# Patient Record
Sex: Male | Born: 1965 | Race: Black or African American | Hispanic: No | Marital: Married | Smoking: Never smoker
Health system: Southern US, Community
[De-identification: ages and names within clinical notes are randomized; demographics above are authoritative.]

## PROBLEM LIST (undated history)

## (undated) DIAGNOSIS — C801 Malignant (primary) neoplasm, unspecified: Secondary | ICD-10-CM

## (undated) DIAGNOSIS — I10 Essential (primary) hypertension: Secondary | ICD-10-CM

## (undated) DIAGNOSIS — Z98811 Dental restoration status: Secondary | ICD-10-CM

## (undated) DIAGNOSIS — M7540 Impingement syndrome of unspecified shoulder: Secondary | ICD-10-CM

## (undated) DIAGNOSIS — M75120 Complete rotator cuff tear or rupture of unspecified shoulder, not specified as traumatic: Secondary | ICD-10-CM

## (undated) DIAGNOSIS — R7303 Prediabetes: Secondary | ICD-10-CM

## (undated) DIAGNOSIS — E7800 Pure hypercholesterolemia, unspecified: Secondary | ICD-10-CM

## (undated) DIAGNOSIS — M19019 Primary osteoarthritis, unspecified shoulder: Secondary | ICD-10-CM

## (undated) HISTORY — PX: SHOULDER ARTHROSCOPY: SHX128

## (undated) HISTORY — PX: EYE SURGERY: SHX253

## (undated) HISTORY — PX: LASIK: SHX215

---

## 1998-03-24 ENCOUNTER — Ambulatory Visit (HOSPITAL_COMMUNITY): Admission: RE | Admit: 1998-03-24 | Discharge: 1998-03-24 | Payer: Self-pay | Admitting: Family Medicine

## 2000-03-25 ENCOUNTER — Ambulatory Visit (HOSPITAL_COMMUNITY): Admission: RE | Admit: 2000-03-25 | Discharge: 2000-03-25 | Payer: Self-pay | Admitting: Family Medicine

## 2000-03-25 ENCOUNTER — Encounter: Payer: Self-pay | Admitting: Family Medicine

## 2000-05-10 ENCOUNTER — Ambulatory Visit (HOSPITAL_COMMUNITY): Admission: RE | Admit: 2000-05-10 | Discharge: 2000-05-10 | Payer: Self-pay | Admitting: Family Medicine

## 2000-05-10 ENCOUNTER — Encounter: Payer: Self-pay | Admitting: Family Medicine

## 2000-12-26 ENCOUNTER — Encounter: Payer: Self-pay | Admitting: Family Medicine

## 2000-12-26 ENCOUNTER — Encounter: Admission: RE | Admit: 2000-12-26 | Discharge: 2000-12-26 | Payer: Self-pay | Admitting: Family Medicine

## 2001-04-16 ENCOUNTER — Ambulatory Visit (HOSPITAL_COMMUNITY): Admission: RE | Admit: 2001-04-16 | Discharge: 2001-04-17 | Payer: Self-pay | Admitting: Orthopedic Surgery

## 2001-04-16 HISTORY — PX: SHOULDER ARTHROSCOPY W/ ROTATOR CUFF REPAIR: SHX2400

## 2001-07-09 ENCOUNTER — Other Ambulatory Visit: Admission: RE | Admit: 2001-07-09 | Discharge: 2001-07-09 | Payer: Self-pay | Admitting: Urology

## 2001-07-09 ENCOUNTER — Encounter (INDEPENDENT_AMBULATORY_CARE_PROVIDER_SITE_OTHER): Payer: Self-pay | Admitting: Specialist

## 2003-05-09 ENCOUNTER — Ambulatory Visit (HOSPITAL_COMMUNITY): Admission: RE | Admit: 2003-05-09 | Discharge: 2003-05-09 | Payer: Self-pay | Admitting: Family Medicine

## 2004-05-24 ENCOUNTER — Encounter: Admission: RE | Admit: 2004-05-24 | Discharge: 2004-05-24 | Payer: Self-pay | Admitting: Family Medicine

## 2006-02-03 ENCOUNTER — Encounter: Admission: RE | Admit: 2006-02-03 | Discharge: 2006-02-03 | Payer: Self-pay | Admitting: Family Medicine

## 2006-07-17 ENCOUNTER — Ambulatory Visit (HOSPITAL_COMMUNITY): Admission: RE | Admit: 2006-07-17 | Discharge: 2006-07-17 | Payer: Self-pay | Admitting: *Deleted

## 2010-05-30 ENCOUNTER — Encounter: Admission: RE | Admit: 2010-05-30 | Discharge: 2010-05-30 | Payer: Self-pay | Admitting: Family Medicine

## 2011-05-10 NOTE — Op Note (Signed)
[place]  Patient:    Austin Daniel, Austin Daniel                        MRN: [id number] Proc. Date: 04/16/01 Adm. Date:  [id number] Disc. Date: [id number] Attending:  Wende Mott                           Operative Report  PREOPERATIVE DIAGNOSIS:  Impingement and rotator cuff tear, right shoulder.  POSTOPERATIVE DIAGNOSIS:  Impingement and rotator cuff tear, right shoulder.  ANESTHESIA:  General.  PROCEDURE: 1. Arthroscopic acromioplasty. 2. Rotator cuff repair.  SURGEON:  Kennieth Rad, M.D.  DESCRIPTION OF PROCEDURE:  The patient was taken to the operating room after being given adequate preoperative medication and was given a general anesthesia and intubated.  The patient was placed in the barber chair position.  The right shoulder was prepped with Duraprep and draped in a sterile manner.  A 1/2 inch puncture wound was made along the posterior aspect of the shoulder, going through the skin and subcutaneous tissues.  A Swisher wire was passed from posterior to anterior.  An anterior incision was then made for inflow of water.  The arthroscope was introduced posteriorly.  A third lateral incision was made for debridement.  With the arthroscope posteriorly and the shaver laterally, the subacromial bursal sac was completely resected.  Debridement of the undersurface of the acromion was done.  The entire endo-rotator cuff was identified.  Next the arthroscope was placed laterally and the shaver was placed posteriorly, and an appropriate acromioplasty was done and decompression.  After adequate resection of the acromion, all loose fragments were removed from the area with the use of the shaver.  A small lateral incision was made into the shoulder going down to the rotator cuff.  A heavy suture was used to reapproximate the rotator cuff. This was a V-shaped type tear that was present.  Copious irrigation was done. Wound closures were done with  #0 Vicryl for the fascia, #2-0 for the subcutaneous, and skin staples for the skin.  A compressive dressing was applied.  A shoulder immobilizer was applied.  The patient tolerated the procedure quite well and went to the recovery room in stable and satisfactory condition.  DISPOSITION:  The patient is kept for 23-hour observation, with ice packs, elevation, pain control.  The patient will be discharged in the morning on Percocet one q.4h. p.r.n. pain, ice packs, and to return to the office in one week.  The patient discharged in stable and satisfactory condition. DD:  06/16/01 TD:  06/16/01 Job: [id number]

## 2011-05-10 NOTE — H&P (Signed)
[place]  Patient:    Austin Daniel, Austin Daniel                        MRN: [id number] Adm. Date:  [id number] Disc. Date: [id number] Attending:  Wende Mott                         History and Physical  CHIEF COMPLAINT:  Painful weak right shoulder.  HISTORY OF PRESENT ILLNESS:  This is a 45 year old male who has sustained injury to his right shoulder lifting an object, and had been treated in the office conservatively with shoulder exercises and anti-inflammatories, but the patients symptoms progressively got worse.  The patient had an MRI done which demonstrated impingement of the right rotator cuff as well as a partial tear of the right rotator cuff.  PAST MEDICAL HISTORY:  No previous operations.  No history of high blood pressure or diabetes mellitus.  ALLERGIES:  No known drug allergies.  CURRENT MEDICATIONS:  Vioxx.  FAMILY HISTORY:  Noncontributory.  SOCIAL HISTORY:  Habits:  Occasional use of alcohol.  REVIEW OF SYSTEMS:  No cardiac, respiratory, urinary, or bowel symptoms.  PHYSICAL EXAMINATION:  GENERAL:  Alert and oriented, in no acute distress.  VITAL SIGNS:  Temperature 97 degrees, pulse 66, respirations 12, blood pressure 122/70.  Height 6 feet 2 inches, weight 214 pounds.  HEENT:  Normocephalic.  Eyes:  Conjunctivae and sclerae clear.  NECK:  Supple.  CHEST:  Clear.  CARDIAC:  S1, S2 regular.  EXTREMITIES:  Right shoulder with limited active range of motion with pain on abduction above 85 degrees and external rotation palpable.  Subacromial crepitus and marked tenderness.  Marked pain as well as weakness on resistive abduction and external rotation.  IMPRESSION:  Impingement syndrome, with rotator cuff tear, right shoulder. DD:  06/16/01 TD:  06/16/01 Job: [id number]

## 2011-11-08 ENCOUNTER — Encounter (HOSPITAL_BASED_OUTPATIENT_CLINIC_OR_DEPARTMENT_OTHER): Payer: Self-pay | Admitting: *Deleted

## 2011-11-08 NOTE — Progress Notes (Signed)
To call dr bland for ekg

## 2011-11-11 ENCOUNTER — Other Ambulatory Visit: Payer: Self-pay | Admitting: Orthopedic Surgery

## 2011-11-12 ENCOUNTER — Encounter (HOSPITAL_BASED_OUTPATIENT_CLINIC_OR_DEPARTMENT_OTHER): Payer: Self-pay | Admitting: Anesthesiology

## 2011-11-12 ENCOUNTER — Encounter (HOSPITAL_BASED_OUTPATIENT_CLINIC_OR_DEPARTMENT_OTHER): Payer: Self-pay | Admitting: Orthopedic Surgery

## 2011-11-12 ENCOUNTER — Encounter (HOSPITAL_BASED_OUTPATIENT_CLINIC_OR_DEPARTMENT_OTHER)
Admission: RE | Disposition: A | Discharge status: Home / Self Care | Payer: Self-pay | Source: Ambulatory Visit | Attending: Orthopedic Surgery

## 2011-11-12 ENCOUNTER — Ambulatory Visit (HOSPITAL_BASED_OUTPATIENT_CLINIC_OR_DEPARTMENT_OTHER): Payer: Worker's Compensation | Admitting: Anesthesiology

## 2011-11-12 ENCOUNTER — Ambulatory Visit (HOSPITAL_BASED_OUTPATIENT_CLINIC_OR_DEPARTMENT_OTHER)
Admission: RE | Admit: 2011-11-12 | Discharge: 2011-11-12 | Disposition: A | Discharge status: Home / Self Care | Payer: Worker's Compensation | Source: Ambulatory Visit | Attending: Orthopedic Surgery | Admitting: Orthopedic Surgery

## 2011-11-12 ENCOUNTER — Encounter (HOSPITAL_BASED_OUTPATIENT_CLINIC_OR_DEPARTMENT_OTHER): Payer: Self-pay | Admitting: *Deleted

## 2011-11-12 DIAGNOSIS — Y990 Civilian activity done for income or pay: Secondary | ICD-10-CM | POA: Insufficient documentation

## 2011-11-12 DIAGNOSIS — Z1889 Other specified retained foreign body fragments: Secondary | ICD-10-CM | POA: Insufficient documentation

## 2011-11-12 DIAGNOSIS — Y9269 Other specified industrial and construction area as the place of occurrence of the external cause: Secondary | ICD-10-CM | POA: Insufficient documentation

## 2011-11-12 DIAGNOSIS — S61209A Unspecified open wound of unspecified finger without damage to nail, initial encounter: Secondary | ICD-10-CM | POA: Insufficient documentation

## 2011-11-12 DIAGNOSIS — X58XXXA Exposure to other specified factors, initial encounter: Secondary | ICD-10-CM | POA: Insufficient documentation

## 2011-11-12 HISTORY — PX: FOREIGN BODY REMOVAL: SHX962

## 2011-11-12 LAB — POCT I-STAT, CHEM 8
BUN: 14 mg/dL (ref 6–23)
Calcium, Ion: 1.09 mmol/L — ABNORMAL LOW (ref 1.12–1.32)
Chloride: 106 mEq/L (ref 96–112)
Creatinine, Ser: 1.1 mg/dL (ref 0.50–1.35)
Glucose, Bld: 89 mg/dL (ref 70–99)
HCT: 47 % (ref 39.0–52.0)
Hemoglobin: 16 g/dL (ref 13.0–17.0)
Potassium: 4.5 mEq/L (ref 3.5–5.1)
Sodium: 140 mEq/L (ref 135–145)
TCO2: 27 mmol/L (ref 0–100)

## 2011-11-12 LAB — GLUCOSE, CAPILLARY: Glucose-Capillary: 90 mg/dL (ref 70–99)

## 2011-11-12 SURGERY — FOREIGN BODY REMOVAL ADULT
Anesthesia: Monitor Anesthesia Care | Site: Finger | Laterality: Right | Wound class: Clean

## 2011-11-12 MED ORDER — CEFAZOLIN SODIUM 1-5 GM-% IV SOLN: 1.0000 g | Freq: Once | INTRAVENOUS | Status: DC

## 2011-11-12 MED ORDER — ONDANSETRON HCL 4 MG/2ML IJ SOLN: 4.0000 mg | Freq: Four times a day (QID) | INTRAMUSCULAR | Status: DC | PRN

## 2011-11-12 MED ORDER — ONDANSETRON HCL 4 MG/2ML IJ SOLN
INTRAMUSCULAR | Status: DC | PRN
  Administered 2011-11-12: 4 mg via INTRAVENOUS

## 2011-11-12 MED ORDER — LIDOCAINE HCL 2 % IJ SOLN
INTRAMUSCULAR | Status: DC | PRN
  Administered 2011-11-12: 4 mL

## 2011-11-12 MED ORDER — HYDROCODONE-ACETAMINOPHEN 5-325 MG PO TABS: ORAL_TABLET | ORAL | Status: DC

## 2011-11-12 MED ORDER — HYDROMORPHONE HCL PF 1 MG/ML IJ SOLN: 0.2500 mg | INTRAMUSCULAR | Status: DC | PRN

## 2011-11-12 MED ORDER — LACTATED RINGERS IV SOLN
INTRAVENOUS | Status: DC
  Administered 2011-11-12 (×3): via INTRAVENOUS

## 2011-11-12 MED ORDER — FENTANYL CITRATE 0.05 MG/ML IJ SOLN
INTRAMUSCULAR | Status: DC | PRN
  Administered 2011-11-12 (×2): 50 ug via INTRAVENOUS

## 2011-11-12 MED ORDER — MIDAZOLAM HCL 5 MG/5ML IJ SOLN
INTRAMUSCULAR | Status: DC | PRN
  Administered 2011-11-12: 2 mg via INTRAVENOUS

## 2011-11-12 MED ORDER — CHLORHEXIDINE GLUCONATE 4 % EX LIQD: 60.0000 mL | Freq: Once | CUTANEOUS | Status: DC

## 2011-11-12 MED ORDER — PROPOFOL 10 MG/ML IV EMUL
INTRAVENOUS | Status: DC | PRN
  Administered 2011-11-12: 20 mg via INTRAVENOUS
  Administered 2011-11-12: 40 mg via INTRAVENOUS
  Administered 2011-11-12: 20 mg via INTRAVENOUS

## 2011-11-12 SURGICAL SUPPLY — 42 items
BANDAGE ADHESIVE 1X3 (GAUZE/BANDAGES/DRESSINGS) IMPLANT
BANDAGE COBAN STERILE 2 (GAUZE/BANDAGES/DRESSINGS) IMPLANT
BLADE MINI RND TIP GREEN BEAV (BLADE) IMPLANT
BLADE SURG 15 STRL LF DISP TIS (BLADE) ×1 IMPLANT
BLADE SURG 15 STRL SS (BLADE) ×1
BNDG COHESIVE 1X5 TAN STRL LF (GAUZE/BANDAGES/DRESSINGS) ×2 IMPLANT
BNDG ESMARK 4X9 LF (GAUZE/BANDAGES/DRESSINGS) ×2 IMPLANT
BRUSH SCRUB EZ PLAIN DRY (MISCELLANEOUS) ×2 IMPLANT
CLOTH BEACON ORANGE TIMEOUT ST (SAFETY) ×2 IMPLANT
CORDS BIPOLAR (ELECTRODE) ×2 IMPLANT
COVER MAYO STAND STRL (DRAPES) ×2 IMPLANT
COVER TABLE BACK 60X90 (DRAPES) ×2 IMPLANT
CUFF TOURNIQUET SINGLE 18IN (TOURNIQUET CUFF) IMPLANT
DECANTER SPIKE VIAL GLASS SM (MISCELLANEOUS) ×2 IMPLANT
DRAIN PENROSE 1/2X12 LTX STRL (WOUND CARE) IMPLANT
DRAIN PENROSE 1/4X12 LTX STRL (WOUND CARE) IMPLANT
DRAPE EXTREMITY T 121X128X90 (DRAPE) ×2 IMPLANT
DRAPE SURG 17X23 STRL (DRAPES) ×2 IMPLANT
GAUZE XEROFORM 1X8 LF (GAUZE/BANDAGES/DRESSINGS) ×2 IMPLANT
GLOVE BIOGEL M STRL SZ7.5 (GLOVE) ×2 IMPLANT
GLOVE ORTHO TXT STRL SZ7.5 (GLOVE) ×2 IMPLANT
GOWN PREVENTION PLUS XLARGE (GOWN DISPOSABLE) ×2 IMPLANT
GOWN PREVENTION PLUS XXLARGE (GOWN DISPOSABLE) ×4 IMPLANT
NEEDLE 27GAX1X1/2 (NEEDLE) IMPLANT
NEEDLE BLUNT 17GA (NEEDLE) IMPLANT
NS IRRIG 1000ML POUR BTL (IV SOLUTION) IMPLANT
PACK BASIN DAY SURGERY FS (CUSTOM PROCEDURE TRAY) ×2 IMPLANT
PADDING CAST ABS 4INX4YD NS (CAST SUPPLIES) ×1
PADDING CAST ABS COTTON 4X4 ST (CAST SUPPLIES) ×1 IMPLANT
SPONGE GAUZE 4X4 12PLY (GAUZE/BANDAGES/DRESSINGS) ×2 IMPLANT
STOCKINETTE 4X48 STRL (DRAPES) ×2 IMPLANT
SUT ETHILON 5 0 P 3 18 (SUTURE) ×1
SUT MERSILENE 4 0 P 3 (SUTURE) IMPLANT
SUT NYLON ETHILON 5-0 P-3 1X18 (SUTURE) ×1 IMPLANT
SUT PROLENE 3 0 PS 2 (SUTURE) IMPLANT
SYR 20CC LL (SYRINGE) IMPLANT
SYR 3ML 23GX1 SAFETY (SYRINGE) IMPLANT
SYR CONTROL 10ML LL (SYRINGE) ×2 IMPLANT
TOWEL OR 17X24 6PK STRL BLUE (TOWEL DISPOSABLE) ×4 IMPLANT
TRAY DSU PREP LF (CUSTOM PROCEDURE TRAY) ×2 IMPLANT
UNDERPAD 30X30 INCONTINENT (UNDERPADS AND DIAPERS) ×2 IMPLANT
WATER STERILE IRR 1000ML POUR (IV SOLUTION) ×2 IMPLANT

## 2011-11-12 NOTE — H&P (Signed)
  H&P documentation: 11/12/11  -History and Physical Reviewed  -Patient has been re-examined  -No change in the plan of care   JR, V

## 2011-11-12 NOTE — Brief Op Note (Signed)
11/12/2011  1:42 PM  PATIENT:  Austin Daniel  45 y.o. male  PRE-OPERATIVE DIAGNOSIS:  foreign body right long finger fiberglass (clear)  POST-OPERATIVE DIAGNOSIS:  foreign body right long finger fiberglass (clear)  PROCEDURE:  Procedure(s): FOREIGN BODY REMOVAL ADULT Right long finger radial aspect  SURGEON:  Surgeon(s): Wyn Forster., MD  PHYSICIAN ASSISTANT:   ASSISTANTS: Annye Rusk, P.A.-C  ANESTHESIA:   IV sedation  EBL:  Total I/O In: 1000 [I.V.:1000] Out: -   BLOOD ADMINISTERED:none  DRAINS: none   LOCAL MEDICATIONS USED:  XYLOCAINE 4 CC  SPECIMEN:  No Specimen  DISPOSITION OF SPECIMEN:  N/A  COUNTS:  YES  TOURNIQUET:   Total Tourniquet Time Documented: Upper Arm (Right) - 7 minutes  DICTATION: .Other Dictation: Dictation Number :[id number]  PLAN OF CARE: Discharge to home after PACU  PATIENT DISPOSITION:  PACU - hemodynamically stable.

## 2011-11-12 NOTE — Op Note (Signed)
NAMELORON, WEIMER NO.:  [id number]  MEDICAL RECORD NO.:  [id number]  LOCATION:                                 FACILITY:  PHYSICIAN:  Katy Fitch. , M.D.      DATE OF BIRTH:  DATE OF PROCEDURE:  11/12/2011 DATE OF DISCHARGE:                              OPERATIVE REPORT   PREOPERATIVE DIAGNOSIS:  Painful fiberglass retained clear foreign body, right long finger radial aspect adjacent to the neurovascular bundle.  POSTOPERATIVE DIAGNOSIS:  Painful fiberglass retained clear foreign body, right long finger radial aspect adjacent to the neurovascular bundle.  OPERATION:  Removal of fiberglass foreign body, dorsal to the neurovascular bundle, right long finger.  OPERATING SURGEON:  Katy Fitch. , MD  ASSISTANT:  Marveen Reeks. Dasnoit, PA-C  ANESTHESIA:  IV sedation supplemented by 2% lidocaine metacarpal head level block, right long finger.  SUPERVISING ANESTHESIOLOGIST:  Achille Rich, MD  INDICATIONS:  Austin Daniel is a 45 year old [employer] employee referred by his Employee [employer] for evaluation and management of a retained fiberglass foreign body.  He was seen in our office in consultation after the foreign body incident occurred on November 06, 2011.  At that time, he was noted to have an entry wound, an area of induration and discomfort adjacent to the radial neurovascular bundle just proximal to the PIP flexion crease.  Plain x-rays of the finger were remarkable for radiolucent dot that might represent a foreign body.  We advised him to proceed with exploration of his finger under local anesthesia, sedation and a tourniquet hemostasis in that this was clear fiberglass and foreign body removal under these circumstances can be quite challenging.  Preoperatively, we reminded him of the potential risks and benefits of surgery.  Our goal was to remove the foreign body and prevent infection. He understands that at times, a second trip is  required particularly with carbon fiber and fiberglass which can fragment leading to a very difficult search for all fragments.  After informed consent, he was brought to the operating room at this time.  PROCEDURE:  Austin Daniel was brought to room 2 of the [facility] and placed in supine position upon  the operating table.  Following the induction of IV sedation, the right arm was prepped with Betadine and 2% lidocaine was infiltrated at metacarpal head level for a total volume of 4 mL.  When anesthesia was satisfactory, the arm was then prepped with Betadine soap and solution and sterilely draped.  A pneumatic tourniquet was applied to the proximal right brachium.  Following exsanguination of the right arm with Esmarch bandage, the arterial tourniquet was inflated to 250 mmHg.  After routine surgical time-out during which we noted his type 2 diabetes and acknowledged his lack of allergies, we proceeded to perform a 2 cm mid lateral incision just dorsal to the entry wound.  Subcutaneous tissues were carefully divided revealing marked subcutaneous edema and granulation tissue formation.  The granulation tissue was gently spread and a clear fiberglass fragment measuring approximately 5 mm in length and approximately 1 mm in width was identified and removed.  We carefully searched dorsal to the neurovascular bundle adjacent to  the flexor sheath and palmar to the entry wound and could not find any other fragments.  I carefully palpated the finger and could not feel any firmness.  It appeared that we had removed a single fragment which was the only fragment to the best of our abilities.  The wound was then examined for bleeding predicaments and none were identified.  The skin was then repaired with intradermal 3-0 Prolene and Steri-Strips.  Mr. Mercadel was placed in compressive dressing with Coban.  We will ask him to return to our office for followup in approximately 6  days for dressing change, and discussion of his postoperative care plan.  For aftercare, he has hydrocodone at home.  He will finish his prescription of doxycycline 100 mg p.o. b.i.d. to be taken as a perioperative prophylactic antibiotic.     Katy Fitch , M.D.     RVS/MEDQ  D:  11/12/2011  T:  11/12/2011  Job:  [id number]

## 2011-11-12 NOTE — Transfer of Care (Signed)
Immediate Anesthesia Transfer of Care Note  Patient: Austin Daniel  Procedure(s) Performed:  FOREIGN BODY REMOVAL ADULT - right long finger  Patient Location: PACU  Anesthesia Type: MAC  Level of Consciousness: awake, alert  and oriented  Airway & Oxygen Therapy: Patient Spontanous Breathing and Patient connected to face mask oxygen  Post-op Assessment: Report given to PACU RN  Post vital signs: stable  Complications: No apparent anesthesia complications

## 2011-11-12 NOTE — Anesthesia Preprocedure Evaluation (Signed)
Anesthesia Evaluation  Patient identified by MRN, date of birth, ID band Patient awake    Reviewed: Allergy & Precautions, H&P , NPO status , Patient's Chart, lab work & pertinent test results  Airway Mallampati: II  Neck ROM: full    Dental   Pulmonary          Cardiovascular     Neuro/Psych    GI/Hepatic   Endo/Other  Diabetes mellitus-  Renal/GU      Musculoskeletal   Abdominal   Peds  Hematology   Anesthesia Other Findings   Reproductive/Obstetrics                           Anesthesia Physical Anesthesia Plan  ASA: II  Anesthesia Plan: MAC   Post-op Pain Management:    Induction: Intravenous  Airway Management Planned: Simple Face Mask  Additional Equipment:   Intra-op Plan:   Post-operative Plan:   Informed Consent: I have reviewed the patients History and Physical, chart, labs and discussed the procedure including the risks, benefits and alternatives for the proposed anesthesia with the patient or authorized representative who has indicated his/her understanding and acceptance.     Plan Discussed with:   Anesthesia Plan Comments:         Anesthesia Quick Evaluation

## 2011-11-12 NOTE — Anesthesia Postprocedure Evaluation (Signed)
Anesthesia Post Note  Patient: Austin Daniel  Procedure(s) Performed:  FOREIGN BODY REMOVAL ADULT - right long finger  Anesthesia type: MAC  Patient location: PACU  Post pain: Pain level controlled and Adequate analgesia  Post assessment: Post-op Vital signs reviewed, Patient's Cardiovascular Status Stable, Respiratory Function Stable, Patent Airway and Pain level controlled  Last Vitals:  Filed Vitals:   11/12/11 1346  BP:   Pulse:   Temp: 36.5 C  Resp: 20    Post vital signs: Reviewed and stable  Level of consciousness: awake, alert  and oriented  Complications: No apparent anesthesia complications

## 2011-11-12 NOTE — Op Note (Signed)
Op note dictated:  11/12/19 MRN:  [id number]  CSN:  [id number]

## 2011-11-12 NOTE — H&P (Signed)
  Austin Daniel is an 45 y.o. male.   Chief Complaint: c/o fibreglass FB radial aspect right long finger HPI: Pt is a 45 y/o male who sustained a penetrating FB from fiberglass at work on [employer]. 11/06/11. Seen by the plant nurse who attempted removal. Then seen by the plant MD who referred to Dr. Teressa Senter. It was felt that the pt. would need removal in the OR.  Past Medical History  Diagnosis Date  . Diabetes mellitus     Past Surgical History  Procedure Date  . Shoulder arthroscopy 2002    rt  . Lasik     History reviewed. No pertinent family history. Social History:  reports that he has never smoked. He does not have any smokeless tobacco history on file. He reports that he does not drink alcohol or use illicit drugs.  Allergies: No Known Allergies  Medications Prior to Admission  Medication Dose Route Frequency Provider Last Rate Last Dose  . lactated ringers infusion   Intravenous Continuous Aubery Lapping, MD 20 mL/hr at 11/12/11 1113     No current outpatient prescriptions on file as of 11/12/2011.    Results for orders placed during the hospital encounter of 11/12/11 (from the past 48 hour(s))  POCT I-STAT, CHEM 8     Status: Abnormal   Collection Time   11/12/11 11:19 AM      Component Value Range Comment   Sodium 140  135 - 145 (mEq/L)    Potassium 4.5  3.5 - 5.1 (mEq/L)    Chloride 106  96 - 112 (mEq/L)    BUN 14  6 - 23 (mg/dL)    Creatinine, Ser 1.61  0.50 - 1.35 (mg/dL)    Glucose, Bld 89  70 - 99 (mg/dL)    Calcium, Ion 0.96 (*) 1.12 - 1.32 (mmol/L)    TCO2 27  0 - 100 (mmol/L)    Hemoglobin 16.0  13.0 - 17.0 (g/dL)    HCT 04.5  40.9 - 81.1 (%)     No results found.   Pertinent items are noted in HPI.  Blood pressure 132/85, pulse 57, temperature 97.6 F (36.4 C), temperature source Oral, resp. rate 18, height 6' (1.829 m), weight 95.709 kg (211 lb), SpO2 100.00%.  General appearance: alert Head: Normocephalic, without obvious abnormality Neck:  supple, symmetrical, trachea midline Resp: clear to auscultation bilaterally Cardio: regular rate and rhythm, S1, S2 normal, no murmur, click, rub or gallop GI: normal findings: bowel sounds normal Extremities:right long finger revealed small puncture wound on radial aspect proximal phalanx. N/V intact. Has FROM. X-rays revealed probable FB. Pulses: 2+ and symmetric Skin: Skin color, texture, turgor normal. No rashes or lesions or mobility and turgor normal Neurologic: Grossly normal Incision/Wound:as above    Assessment/Plan Fiberglass foreign body Right long finger proximal phalanx.   Plan : to OR for exploration and removal FB right long finger. , J 11/12/2011, 12:52 PM

## 2011-11-18 ENCOUNTER — Encounter (HOSPITAL_BASED_OUTPATIENT_CLINIC_OR_DEPARTMENT_OTHER): Payer: Self-pay | Admitting: Orthopedic Surgery

## 2013-04-22 DIAGNOSIS — M25819 Other specified joint disorders, unspecified shoulder: Secondary | ICD-10-CM

## 2013-04-22 DIAGNOSIS — M7540 Impingement syndrome of unspecified shoulder: Secondary | ICD-10-CM

## 2013-04-22 DIAGNOSIS — M75120 Complete rotator cuff tear or rupture of unspecified shoulder, not specified as traumatic: Secondary | ICD-10-CM

## 2013-04-22 DIAGNOSIS — M19019 Primary osteoarthritis, unspecified shoulder: Secondary | ICD-10-CM

## 2013-04-22 HISTORY — DX: Impingement syndrome of unspecified shoulder: M75.40

## 2013-04-22 HISTORY — DX: Complete rotator cuff tear or rupture of unspecified shoulder, not specified as traumatic: M75.120

## 2013-04-22 HISTORY — DX: Primary osteoarthritis, unspecified shoulder: M19.019

## 2013-04-22 HISTORY — DX: Other specified joint disorders, unspecified shoulder: M25.819

## 2013-04-23 ENCOUNTER — Encounter (HOSPITAL_BASED_OUTPATIENT_CLINIC_OR_DEPARTMENT_OTHER): Payer: Self-pay | Admitting: *Deleted

## 2013-04-23 NOTE — Pre-Procedure Instructions (Signed)
To come for BMET; EKG req. from Dr. Tedra Senegal office.

## 2013-04-27 ENCOUNTER — Encounter (HOSPITAL_BASED_OUTPATIENT_CLINIC_OR_DEPARTMENT_OTHER)
Admission: RE | Admit: 2013-04-27 | Discharge: 2013-04-27 | Disposition: A | Discharge status: Home / Self Care | Payer: Worker's Compensation | Source: Ambulatory Visit | Attending: Orthopedic Surgery | Admitting: Orthopedic Surgery

## 2013-04-27 LAB — BASIC METABOLIC PANEL
BUN: 13 mg/dL (ref 6–23)
CO2: 29 mEq/L (ref 19–32)
Calcium: 9.5 mg/dL (ref 8.4–10.5)
Chloride: 100 mEq/L (ref 96–112)
Creatinine, Ser: 1.21 mg/dL (ref 0.50–1.35)
GFR calc Af Amer: 80 mL/min — ABNORMAL LOW (ref >90)
GFR calc non Af Amer: 69 mL/min — ABNORMAL LOW (ref >90)
Glucose, Bld: 102 mg/dL — ABNORMAL HIGH (ref 70–99)
Potassium: 4.4 mEq/L (ref 3.5–5.1)
Sodium: 137 mEq/L (ref 135–145)

## 2013-04-28 NOTE — H&P (Signed)
/[facility] [address]   [address] [place], [place] [address] [phone] A Division of [facility]  Loreta Ave, M.D.   Robert A. Thurston Hole, M.D.   Burnell Blanks, M.D.   Eulas Post, M.D.   Lunette Stands, M.D Buford Dresser, M.D.  Charlsie Quest, M.D.   Estell Harpin, M.D.   Melina Fiddler, M.D. Genene Churn. Barry Dienes, PA-C            Kirstin A. Shepperson, PA-C Josh Leetsdale, PA-C [place], [place]   RE: Austin, Daniel                                [id number]      DOB: 1966-02-24 PROGRESS NOTE: 03-23-13 Austin Daniel comes in for a second opinion in regards to ongoing dilemma with his left shoulder.  I have reviewed his evaluation, workup and treatment notes by Dr. Thomasena Edis.  This includes office evaluation, MRI scan, op notes and post-op notes.  Issues began when he caught a falling package at work weighing about 25 pounds.  He felt something pop in his shoulder.  He was seen at [facility] by Dr. Thomasena Edis at that time.  Initially treated as a cuff strain, irritation of his AC joint from this injury.  Conservative treatment without resolution.  No significant issues or problems with his shoulder prior to that injury that occurred in September of 2012.  With failure of improvement eventual MRI scan was obtained.  MRI showed a partial thickness tear articular surface infraspinatus and supraspinatus tendons.  Moderate AC arthropathy narrowing the outlet.  Given failure to improve he was brought to the operating room for decompression, debridement, acromioplasty and distal clavicle excision.  That was done by Dr. Thomasena Edis in 2012.  At that time operative notes reflected the cuff really looked very reasonable and there was certainly no indication to do anything more aggressive than his decompression.  Initially had improvement post-op, but his symptoms have persisted, never completely resolved and he is getting more  frustrated.  Previously released at [facility] by Dr. Thomasena Edis, but he was reevaluated there on February 19, 2013.  X-rays had shown the previous decompression and distal clavicle excision and there was nothing else untoward.  Although the proper procedure was done well he is having continued symptoms that do relate to his cuff.  He has not had a follow up MRI.   Remaining history is thoroughly reviewed, updated and included in the chart.        EXAMINATION: General exam is outlined and included in the chart.  Specifically, this is a healthy appearing 47 year-old male in no acute distress.  Height: 6?2.  Weight: 220 pounds.  I can get his left shoulder through full motoin, but you have to go slowly, as he does have a painful arc 70-120 degrees.  Fairly good strength.  Definite cuff irritation.  Not getting a lot in the way of labral or biceps signs.  AC interval is a little bit sore, but not too dramatic.  No apprehension or instability.  Neurovascularly intact distally.  Nothing that looks like it is coming from his cervical spine.  Opposite right shoulder full motion.  Good strength.  No cuff irritation or instability.      X-RAYS: Two view x-ray shows what looks like an adequate decompression, Type I acromion and distal clavicle excision.  DISPOSITION:  Persistent cuff symptoms after injury, decompression and physical therapy.  I think we need to repeat his MRI with arthrogram to look at his cuff to make further decision.  The one question remaining is whether or not he had a significant enough partial tear that has now progressed.  I will follow up with him when the scan is complete.    Loreta Ave, M.D.   Electronically verified by Loreta Ave, M.D. DFM:jjh Cc: Dr. Valma Cava, fax: [phone] Cc: Kelvin Cellar, fax: [phone] D 03-23-13 T 03-24-13  /[facility] [address]   [address] [place], [place] [address] [phone] A Division of [facility]  Loreta Ave, M.D.   Robert A. Thurston Hole, M.D.   Burnell Blanks, M.D.   Eulas Post, M.D.   Lunette Stands, M.D Buford Dresser, M.D.  Charlsie Quest, M.D.   Estell Harpin, M.D.   Melina Fiddler, M.D. Genene Churn. Barry Dienes, PA-C            Kirstin A. Shepperson, PA-C Josh Spickard, PA-C [place], [place]   RE: Austin, Daniel   [id number]      DOB: 24-Apr-1966 PROGRESS NOTE: 04-20-13 Austin Daniel is seen for follow-up. I went over his MRI scan. I met with him and his rehab coordinator. Initial traumatic event in 2012. Workup and treatment notes by Dr. Thomasena Edis reviewed. Failed conservative treatment. MRI at that time showed a small partial thickness tear articular surface insertional area of the infraspinatus and back of the supraspinatus. He had decompression. Operative notes reflect mild partial tearing nothing extreme enough to warrant repair or further intervention. Recent symptoms now intolerable. Repeat x-rays showed a little overgrowth of the acromion but adequate decompression and distal clavicle excision.  Recent MRI shows significant progression of tearing in the same area as before. This looks like it is approaching a functional full thickness tear. Symptoms go along with that.  I spent more than 25 minutes face to face covering this with him and his rehab coordinator. This is going to require operative intervention and they both understand. Plan exam under anesthesia arthroscopy revision acromioplasty if needed. Arthroscopically assisted rotator cuff repair. I did a rotator cuff repair on his right shoulder in 2008 and he is doing well with that and he knows what to expect. Paperwork complete all questions answered. We will do this next week. He will need to go off ibuprofen symptoms are going to get worse but we need to do that pre-op. Out of work from now until 12 weeks post-op.   Loreta Ave, M.D.  Electronically verified  by Loreta Ave, M.D. DFM:kh Cc:  Kelvin Cellar,  [phone] D 04-20-13 T 04-21-13

## 2013-04-29 ENCOUNTER — Encounter (HOSPITAL_BASED_OUTPATIENT_CLINIC_OR_DEPARTMENT_OTHER): Payer: Self-pay | Admitting: *Deleted

## 2013-04-29 ENCOUNTER — Ambulatory Visit (HOSPITAL_BASED_OUTPATIENT_CLINIC_OR_DEPARTMENT_OTHER)
Admission: RE | Admit: 2013-04-29 | Discharge: 2013-04-29 | Disposition: A | Discharge status: Home / Self Care | Payer: Worker's Compensation | Source: Ambulatory Visit | Attending: Orthopedic Surgery | Admitting: Orthopedic Surgery

## 2013-04-29 ENCOUNTER — Ambulatory Visit (HOSPITAL_BASED_OUTPATIENT_CLINIC_OR_DEPARTMENT_OTHER): Payer: Worker's Compensation | Admitting: Certified Registered Nurse Anesthetist

## 2013-04-29 ENCOUNTER — Encounter (HOSPITAL_BASED_OUTPATIENT_CLINIC_OR_DEPARTMENT_OTHER): Payer: Self-pay | Admitting: Certified Registered Nurse Anesthetist

## 2013-04-29 ENCOUNTER — Encounter (HOSPITAL_BASED_OUTPATIENT_CLINIC_OR_DEPARTMENT_OTHER)
Admission: RE | Disposition: A | Discharge status: Home / Self Care | Payer: Self-pay | Source: Ambulatory Visit | Attending: Orthopedic Surgery

## 2013-04-29 DIAGNOSIS — S46919A Strain of unspecified muscle, fascia and tendon at shoulder and upper arm level, unspecified arm, initial encounter: Secondary | ICD-10-CM | POA: Insufficient documentation

## 2013-04-29 DIAGNOSIS — M719 Bursopathy, unspecified: Secondary | ICD-10-CM | POA: Insufficient documentation

## 2013-04-29 DIAGNOSIS — M67919 Unspecified disorder of synovium and tendon, unspecified shoulder: Secondary | ICD-10-CM | POA: Insufficient documentation

## 2013-04-29 DIAGNOSIS — X58XXXA Exposure to other specified factors, initial encounter: Secondary | ICD-10-CM | POA: Insufficient documentation

## 2013-04-29 DIAGNOSIS — S43429A Sprain of unspecified rotator cuff capsule, initial encounter: Secondary | ICD-10-CM | POA: Insufficient documentation

## 2013-04-29 DIAGNOSIS — S46819A Strain of other muscles, fascia and tendons at shoulder and upper arm level, unspecified arm, initial encounter: Secondary | ICD-10-CM | POA: Insufficient documentation

## 2013-04-29 DIAGNOSIS — S43439A Superior glenoid labrum lesion of unspecified shoulder, initial encounter: Secondary | ICD-10-CM | POA: Insufficient documentation

## 2013-04-29 DIAGNOSIS — M19019 Primary osteoarthritis, unspecified shoulder: Secondary | ICD-10-CM | POA: Insufficient documentation

## 2013-04-29 DIAGNOSIS — Z9889 Other specified postprocedural states: Secondary | ICD-10-CM

## 2013-04-29 HISTORY — DX: Pure hypercholesterolemia, unspecified: E78.00

## 2013-04-29 HISTORY — DX: Primary osteoarthritis, unspecified shoulder: M19.019

## 2013-04-29 HISTORY — PX: SHOULDER ARTHROSCOPY WITH ROTATOR CUFF REPAIR AND SUBACROMIAL DECOMPRESSION: SHX5686

## 2013-04-29 HISTORY — DX: Impingement syndrome of unspecified shoulder: M75.40

## 2013-04-29 HISTORY — DX: Dental restoration status: Z98.811

## 2013-04-29 HISTORY — DX: Complete rotator cuff tear or rupture of unspecified shoulder, not specified as traumatic: M75.120

## 2013-04-29 LAB — POCT HEMOGLOBIN-HEMACUE: Hemoglobin: 16.8 g/dL (ref 13.0–17.0)

## 2013-04-29 LAB — GLUCOSE, CAPILLARY
Glucose-Capillary: 78 mg/dL (ref 70–99)
Glucose-Capillary: 85 mg/dL (ref 70–99)

## 2013-04-29 SURGERY — SHOULDER ARTHROSCOPY WITH ROTATOR CUFF REPAIR AND SUBACROMIAL DECOMPRESSION
Anesthesia: General | Site: Shoulder | Laterality: Left | Wound class: Clean

## 2013-04-29 MED ORDER — CEFAZOLIN SODIUM-DEXTROSE 2-3 GM-% IV SOLR
2.0000 g | INTRAVENOUS | Status: AC
  Administered 2013-04-29: 2 g via INTRAVENOUS

## 2013-04-29 MED ORDER — PROPOFOL 10 MG/ML IV BOLUS
INTRAVENOUS | Status: DC | PRN
  Administered 2013-04-29: 300 mg via INTRAVENOUS

## 2013-04-29 MED ORDER — DEXAMETHASONE SODIUM PHOSPHATE 4 MG/ML IJ SOLN
INTRAMUSCULAR | Status: DC | PRN
  Administered 2013-04-29: 10 mg via INTRAVENOUS

## 2013-04-29 MED ORDER — SODIUM CHLORIDE 0.9 % IR SOLN
Status: DC | PRN
  Administered 2013-04-29: 3000 mL

## 2013-04-29 MED ORDER — BUPIVACAINE-EPINEPHRINE PF 0.5-1:200000 % IJ SOLN
INTRAMUSCULAR | Status: DC | PRN
  Administered 2013-04-29: 23 mL

## 2013-04-29 MED ORDER — OXYCODONE-ACETAMINOPHEN 5-325 MG PO TABS: 1.0000 | ORAL_TABLET | ORAL | Status: DC | PRN

## 2013-04-29 MED ORDER — MIDAZOLAM HCL 2 MG/2ML IJ SOLN
1.0000 mg | INTRAMUSCULAR | Status: DC | PRN
  Administered 2013-04-29: 2 mg via INTRAVENOUS

## 2013-04-29 MED ORDER — LACTATED RINGERS IV SOLN
INTRAVENOUS | Status: DC
  Administered 2013-04-29 (×3): via INTRAVENOUS

## 2013-04-29 MED ORDER — SUCCINYLCHOLINE CHLORIDE 20 MG/ML IJ SOLN
INTRAMUSCULAR | Status: DC | PRN
  Administered 2013-04-29: 100 mg via INTRAVENOUS

## 2013-04-29 MED ORDER — LIDOCAINE HCL (CARDIAC) 20 MG/ML IV SOLN
INTRAVENOUS | Status: DC | PRN
  Administered 2013-04-29: 30 mg via INTRAVENOUS

## 2013-04-29 MED ORDER — FENTANYL CITRATE 0.05 MG/ML IJ SOLN
INTRAMUSCULAR | Status: DC | PRN
  Administered 2013-04-29: 25 ug via INTRAVENOUS

## 2013-04-29 MED ORDER — FENTANYL CITRATE 0.05 MG/ML IJ SOLN
50.0000 ug | INTRAMUSCULAR | Status: DC | PRN
  Administered 2013-04-29: 100 ug via INTRAVENOUS

## 2013-04-29 MED ORDER — MIDAZOLAM HCL 5 MG/5ML IJ SOLN
INTRAMUSCULAR | Status: DC | PRN
  Administered 2013-04-29: 1 mg via INTRAVENOUS

## 2013-04-29 MED ORDER — MIDAZOLAM HCL 2 MG/ML PO SYRP: 12.0000 mg | ORAL_SOLUTION | Freq: Once | ORAL | Status: DC | PRN

## 2013-04-29 SURGICAL SUPPLY — 66 items
ANCHOR PEEK SWIVEL LOCK 5.5 (Anchor) ×4 IMPLANT
BENZOIN TINCTURE PRP APPL 2/3 (GAUZE/BANDAGES/DRESSINGS) IMPLANT
BLADE CUTTER GATOR 3.5 (BLADE) ×2 IMPLANT
BLADE CUTTER MENIS 5.5 (BLADE) IMPLANT
BLADE GREAT WHITE 4.2 (BLADE) ×2 IMPLANT
BLADE SURG 15 STRL LF DISP TIS (BLADE) IMPLANT
BLADE SURG 15 STRL SS (BLADE)
BUR OVAL 6.0 (BURR) ×2 IMPLANT
CANISTER OMNI JUG 16 LITER (MISCELLANEOUS) ×2 IMPLANT
CANISTER SUCTION 2500CC (MISCELLANEOUS) IMPLANT
CANNULA DRY DOC 8X75 (CANNULA) ×2 IMPLANT
CANNULA TWIST IN 8.25X7CM (CANNULA) IMPLANT
CLOTH BEACON ORANGE TIMEOUT ST (SAFETY) ×2 IMPLANT
DECANTER SPIKE VIAL GLASS SM (MISCELLANEOUS) IMPLANT
DRAPE OEC MINIVIEW 54X84 (DRAPES) IMPLANT
DRAPE STERI 35X30 U-POUCH (DRAPES) ×2 IMPLANT
DRAPE U-SHAPE 47X51 STRL (DRAPES) ×2 IMPLANT
DRAPE U-SHAPE 76X120 STRL (DRAPES) ×4 IMPLANT
DRSG PAD ABDOMINAL 8X10 ST (GAUZE/BANDAGES/DRESSINGS) ×2 IMPLANT
DURAPREP 26ML APPLICATOR (WOUND CARE) ×2 IMPLANT
ELECT MENISCUS 165MM 90D (ELECTRODE) ×2 IMPLANT
ELECT NEEDLE TIP 2.8 STRL (NEEDLE) IMPLANT
ELECT REM PT RETURN 9FT ADLT (ELECTROSURGICAL) ×2
ELECTRODE REM PT RTRN 9FT ADLT (ELECTROSURGICAL) ×1 IMPLANT
GAUZE XEROFORM 1X8 LF (GAUZE/BANDAGES/DRESSINGS) ×2 IMPLANT
GLOVE BIOGEL PI IND STRL 8 (GLOVE) ×1 IMPLANT
GLOVE BIOGEL PI INDICATOR 8 (GLOVE) ×1
GLOVE ORTHO TXT STRL SZ7.5 (GLOVE) ×4 IMPLANT
GOWN PREVENTION PLUS XLARGE (GOWN DISPOSABLE) ×2 IMPLANT
GOWN STRL REIN 2XL XLG LVL4 (GOWN DISPOSABLE) ×2 IMPLANT
NDL SUT 6 .5 CRC .975X.05 MAYO (NEEDLE) IMPLANT
NEEDLE MAYO TAPER (NEEDLE)
NEEDLE SCORPION MULTI FIRE (NEEDLE) ×2 IMPLANT
NS IRRIG 1000ML POUR BTL (IV SOLUTION) IMPLANT
PACK ARTHROSCOPY DSU (CUSTOM PROCEDURE TRAY) ×2 IMPLANT
PACK BASIN DAY SURGERY FS (CUSTOM PROCEDURE TRAY) ×2 IMPLANT
PASSER SUT SWANSON 36MM LOOP (INSTRUMENTS) IMPLANT
PENCIL BUTTON HOLSTER BLD 10FT (ELECTRODE) ×2 IMPLANT
SET ARTHROSCOPY TUBING (MISCELLANEOUS) ×1
SET ARTHROSCOPY TUBING LN (MISCELLANEOUS) ×1 IMPLANT
SLEEVE SCD COMPRESS KNEE MED (MISCELLANEOUS) ×2 IMPLANT
SLING ARM FOAM STRAP LRG (SOFTGOODS) IMPLANT
SLING ARM FOAM STRAP MED (SOFTGOODS) IMPLANT
SLING ARM FOAM STRAP XLG (SOFTGOODS) IMPLANT
SLING ARM IMMOBILIZER LRG (SOFTGOODS) ×2 IMPLANT
SLING ARM IMMOBILIZER MED (SOFTGOODS) IMPLANT
SPONGE GAUZE 4X4 12PLY (GAUZE/BANDAGES/DRESSINGS) ×4 IMPLANT
SPONGE LAP 4X18 X RAY DECT (DISPOSABLE) IMPLANT
STRIP CLOSURE SKIN 1/2X4 (GAUZE/BANDAGES/DRESSINGS) IMPLANT
SUCTION FRAZIER TIP 10 FR DISP (SUCTIONS) IMPLANT
SUT ETHIBOND 2 OS 4 DA (SUTURE) IMPLANT
SUT ETHILON 2 0 FS 18 (SUTURE) IMPLANT
SUT ETHILON 3 0 PS 1 (SUTURE) ×2 IMPLANT
SUT FIBERWIRE #2 38 T-5 BLUE (SUTURE)
SUT RETRIEVER MED (INSTRUMENTS) IMPLANT
SUT TIGER TAPE 7 IN WHITE (SUTURE) IMPLANT
SUT VIC AB 0 CT1 27 (SUTURE)
SUT VIC AB 0 CT1 27XBRD ANBCTR (SUTURE) IMPLANT
SUT VIC AB 2-0 SH 27 (SUTURE)
SUT VIC AB 2-0 SH 27XBRD (SUTURE) IMPLANT
SUT VIC AB 3-0 FS2 27 (SUTURE) IMPLANT
SUTURE FIBERWR #2 38 T-5 BLUE (SUTURE) IMPLANT
TAPE FIBER 2MM 7IN #2 BLUE (SUTURE) IMPLANT
TOWEL OR 17X24 6PK STRL BLUE (TOWEL DISPOSABLE) ×2 IMPLANT
WATER STERILE IRR 1000ML POUR (IV SOLUTION) ×2 IMPLANT
YANKAUER SUCT BULB TIP NO VENT (SUCTIONS) IMPLANT

## 2013-04-29 NOTE — Anesthesia Preprocedure Evaluation (Signed)
Anesthesia Evaluation  Patient identified by MRN, date of birth, ID band Patient awake    Reviewed: Allergy & Precautions, H&P , NPO status , Patient's Chart, lab work & pertinent test results  Airway Mallampati: I TM Distance: >3 FB Neck ROM: Full    Dental  (+) Teeth Intact and Dental Advisory Given   Pulmonary  breath sounds clear to auscultation        Cardiovascular Rhythm:Regular Rate:Normal     Neuro/Psych    GI/Hepatic   Endo/Other  diabetes, Well Controlled, Type 2, Oral Hypoglycemic Agents  Renal/GU      Musculoskeletal   Abdominal   Peds  Hematology   Anesthesia Other Findings   Reproductive/Obstetrics                           Anesthesia Physical Anesthesia Plan  ASA: II  Anesthesia Plan: General   Post-op Pain Management:    Induction: Intravenous  Airway Management Planned: Oral ETT  Additional Equipment:   Intra-op Plan:   Post-operative Plan: Extubation in OR  Informed Consent: I have reviewed the patients History and Physical, chart, labs and discussed the procedure including the risks, benefits and alternatives for the proposed anesthesia with the patient or authorized representative who has indicated his/her understanding and acceptance.   Dental advisory given  Plan Discussed with: CRNA, Anesthesiologist and Surgeon  Anesthesia Plan Comments:         Anesthesia Quick Evaluation

## 2013-04-29 NOTE — Transfer of Care (Signed)
Immediate Anesthesia Transfer of Care Note  Patient: Austin Daniel  Procedure(s) Performed: Procedure(s): LEFT SHOULDER ARTHROSCOPY WITH SUBACROMIAL DECOMPRESSION, PARTIAL ACROMIOPLASTY, WITH CORACOACROMIAL RELEASE, DISTAL CLAVICULECTOMY, WITH ROTATOR CUFF REPAIR (Left)  Patient Location: PACU  Anesthesia Type:GA combined with regional for post-op pain  Level of Consciousness: awake, alert , oriented and patient cooperative  Airway & Oxygen Therapy: Patient Spontanous Breathing and Patient connected to face mask oxygen  Post-op Assessment: Report given to PACU RN and Post -op Vital signs reviewed and stable  Post vital signs: Reviewed and stable  Complications: No apparent anesthesia complications

## 2013-04-29 NOTE — Progress Notes (Signed)
Assisted Dr. Crews with left, ultrasound guided, interscalene  block. Side rails up, monitors on throughout procedure. See vital signs in flow sheet. Tolerated Procedure well. 

## 2013-04-29 NOTE — Anesthesia Postprocedure Evaluation (Signed)
  Anesthesia Post-op Note  Patient: Austin Daniel  Procedure(s) Performed: Procedure(s): LEFT SHOULDER ARTHROSCOPY WITH SUBACROMIAL DECOMPRESSION, PARTIAL ACROMIOPLASTY, WITH CORACOACROMIAL RELEASE, DISTAL CLAVICULECTOMY, WITH ROTATOR CUFF REPAIR (Left)  Patient Location: PACU  Anesthesia Type:GA combined with regional for post-op pain  Level of Consciousness: awake, alert  and oriented  Airway and Oxygen Therapy: Patient Spontanous Breathing and Patient connected to face mask oxygen  Post-op Pain: none  Post-op Assessment: Post-op Vital signs reviewed  Post-op Vital Signs: Reviewed  Complications: No apparent anesthesia complications

## 2013-04-29 NOTE — Interval H&P Note (Signed)
History and Physical Interval Note:  04/29/2013 7:24 AM  Austin Daniel  has presented today for surgery, with the diagnosis of LEFT SHOULDER IMPINGEMENT SYNDROME, DEGENERATIVE ARTHRITIS, A/C JOINT COMPLETE RUPTURE OF ROTATOR CUFF  The various methods of treatment have been discussed with the patient and family. After consideration of risks, benefits and other options for treatment, the patient has consented to  Procedure(s): LEFT SHOULDER ARTHROSCOPY WITH SUBACROMIAL DECOMPRESSION, PARTIAL ACROMIOPLASTY, WITH CORACOACROMIAL RELEASE, DISTAL CLAVICULECTOMY, WITH ROTATOR CUFF REPAIR (Left) as a surgical intervention .  The patient's history has been reviewed, patient examined, no change in status, stable for surgery.  I have reviewed the patient's chart and labs.  Questions were answered to the patient's satisfaction.     , F

## 2013-04-29 NOTE — Brief Op Note (Signed)
04/29/2013  1:23 PM  PATIENT:  Austin Daniel  47 y.o. male  PRE-OPERATIVE DIAGNOSIS:  LEFT SHOULDER IMPINGEMENT SYNDROME, DEGENERATIVE ARTHRITIS, A/C JOINT COMPLETE RUPTURE OF ROTATOR CUFF  POST-OPERATIVE DIAGNOSIS:  Left shoulder Impingement, Rotator Cuff Tear  PROCEDURE:  Procedure(s): LEFT SHOULDER ARTHROSCOPY WITH SUBACROMIAL DECOMPRESSION, PARTIAL ACROMIOPLASTY, WITH CORACOACROMIAL RELEASE, DISTAL CLAVICULECTOMY, WITH ROTATOR CUFF REPAIR (Left)  SURGEON:  Surgeon(s) and Role:    * Loreta Ave, MD - Primary  PHYSICIAN ASSISTANT: , M   ANESTHESIA:   general  EBL:  Total I/O In: 2130 [P.O.:330; I.V.:1800] Out: -   BLOOD ADMINISTERED:none   SPECIMEN:  No Specimen  DISPOSITION OF SPECIMEN:  N/A  COUNTS:  YES  TOURNIQUET:  * No tourniquets in log *   PATIENT DISPOSITION:  PACU - hemodynamically stable.

## 2013-04-29 NOTE — Anesthesia Procedure Notes (Addendum)
Anesthesia Regional Block:  Interscalene brachial plexus block  Pre-Anesthetic Checklist: ,, timeout performed, Correct Patient, Correct Site, Correct Laterality, Correct Procedure, Correct Position, site marked, Risks and benefits discussed,  Surgical consent,  Pre-op evaluation,  At surgeon's request and post-op pain management  Laterality: Left and Upper  Prep: chloraprep       Needles:  Injection technique: Single-shot  Needle Type: Echogenic Needle     Needle Length: 5cm 5 cm Needle Gauge: 21    Additional Needles:  Procedures: ultrasound guided (picture in chart) Interscalene brachial plexus block Narrative:  Start time: 04/29/2013 9:40 AM End time: 04/29/2013 9:46 AM Injection made incrementally with aspirations every 5 mL.  Performed by: Personally  Anesthesiologist: Sheldon Silvan, MD  Interscalene brachial plexus block Procedure Name: Intubation Date/Time: 04/29/2013 10:43 AM Performed by: Brylyn Novakovich D Pre-anesthesia Checklist: Patient identified, Emergency Drugs available, Suction available and Patient being monitored Patient Re-evaluated:Patient Re-evaluated prior to inductionOxygen Delivery Method: Circle System Utilized Preoxygenation: Pre-oxygenation with 100% oxygen Intubation Type: IV induction Ventilation: Mask ventilation without difficulty Laryngoscope Size: Mac and 3 Grade View: Grade II Tube type: Oral Number of attempts: 1 Airway Equipment and Method: stylet and LTA kit utilized Placement Confirmation: ETT inserted through vocal cords under direct vision,  positive ETCO2 and breath sounds checked- equal and bilateral Secured at: 22 cm Tube secured with: Tape Dental Injury: Teeth and Oropharynx as per pre-operative assessment

## 2013-04-30 ENCOUNTER — Encounter (HOSPITAL_BASED_OUTPATIENT_CLINIC_OR_DEPARTMENT_OTHER): Payer: Self-pay | Admitting: Orthopedic Surgery

## 2013-04-30 NOTE — Op Note (Signed)
NAMEMarland Daniel  RAYBURN, MUNDIS NO.:  [id number]  MEDICAL RECORD NO.:  [id number]  LOCATION:                                 FACILITY:  PHYSICIAN:  Loreta Ave, M.D. DATE OF BIRTH:  15-Dec-1966  DATE OF PROCEDURE:  04/29/2013 DATE OF DISCHARGE:                              OPERATIVE REPORT   PREOPERATIVE DIAGNOSES:  Left shoulder rotator cuff tear supraspinatus tendon.  Labral tear.  Previous decompression with acromioplasty distal clavicle excision.  POSTOPERATIVE DIAGNOSES:  Left shoulder rotator cuff tear supraspinatus tendon.  Labral tear.  Previous decompression with acromioplasty distal clavicle excision with some mild overgrowth of the acromion and reactive bursitis.  Full-thickness tear, supraspinatus tendon throughout the crescent region, with a fair amount of intertendinous tearing.  Superior labral tear.  Biceps intact.  PROCEDURE:  Right shoulder exam under anesthesia, arthroscopy. Debridement of labrum, rotator cuff with mobilization.  Bursectomy, revision acromioplasty.  Arthroscopic-assisted rotator cuff repair with Fiber Weave suture x2, swivel lock anchors x2.  SURGEON:  Loreta Ave, M.D.  ASSISTANT:  Genene Churn. Barry Dienes, [place], present throughout the entire case necessary for timely completion of the procedure.  ANESTHESIA:  General.  BLOOD LOSS:  Minimal.  SPECIMENS:  None.  CULTURES:  None.  COMPLICATIONS:  None.  DRESSING:  Soft compressive shoulder immobilizer.  PROCEDURE IN DETAIL:  The patient was brought to the operating room, placed on the operating table in supine position.  After adequate anesthesia had been obtained, placed in beach-chair position on the shoulder positioner, prepped and draped in usual sterile fashion.  Full motion, stable shoulder.  Three portals anterior, posterior, lateral. Arthroscope introduced, shoulder distended and inspected.  Full- thickness tear supraspinatus tendon throughout the crescent  region. Some intact fibers with  functionally full-thickness throughout.  Fair amount of intertendinous tearing degeneration throughout that area in the watershed region, supraspinatus.  Debrided, mobilize tuberosity roughened.  Infraspinatus partial tearing, above and below with nothing full thickness.  From above, bursa adhesions were removed.  Little overgrowth from the acromion with revision acromioplasty.  Adequate distal clavicle excision.  Cannula placed in the lateral portal.  The mobilize cuff was captured 2 horizontal mattress sutures, Fiber Weave. Anchored down the roughened tuberosity with 2 swivel lock anchors. Nice, firm, watertight closure achieved. Adequacy of decompression confirmed.  Instruments were removed.  Portals were closed with nylon.  Sterile compressive dressing applied.  Shoulder mobilizer applied.  Anesthesia reversed.  Brought to the recovery room. Tolerated the surgery well.  No complications.     Loreta Ave, M.D.     DFM/MEDQ  D:  04/29/2013  T:  04/30/2013  Job:  [id number]

## 2017-03-13 ENCOUNTER — Ambulatory Visit
Admission: RE | Admit: 2017-03-13 | Discharge: 2017-03-13 | Disposition: A | Discharge status: Home / Self Care | Payer: Managed Care, Other (non HMO) | Source: Ambulatory Visit | Attending: Family Medicine | Admitting: Family Medicine

## 2017-03-13 ENCOUNTER — Other Ambulatory Visit: Payer: Self-pay | Admitting: Family Medicine

## 2017-03-13 DIAGNOSIS — M25562 Pain in left knee: Secondary | ICD-10-CM

## 2018-02-20 DIAGNOSIS — I10 Essential (primary) hypertension: Secondary | ICD-10-CM | POA: Diagnosis not present

## 2018-02-20 DIAGNOSIS — E782 Mixed hyperlipidemia: Secondary | ICD-10-CM | POA: Diagnosis not present

## 2018-02-20 DIAGNOSIS — R7309 Other abnormal glucose: Secondary | ICD-10-CM | POA: Diagnosis not present

## 2018-02-20 DIAGNOSIS — E785 Hyperlipidemia, unspecified: Secondary | ICD-10-CM | POA: Diagnosis not present

## 2018-02-20 DIAGNOSIS — E1100 Type 2 diabetes mellitus with hyperosmolarity without nonketotic hyperglycemic-hyperosmolar coma (NKHHC): Secondary | ICD-10-CM | POA: Diagnosis not present

## 2018-05-17 IMAGING — CR DG KNEE COMPLETE 4+V*L*
4 series · 4 of 4 positions shown · non-contrast
Comparison: None.

CLINICAL DATA: Left knee pain for 2 weeks without known injury.

EXAM:
LEFT KNEE - COMPLETE 4+ VIEW

[w knee ap left]
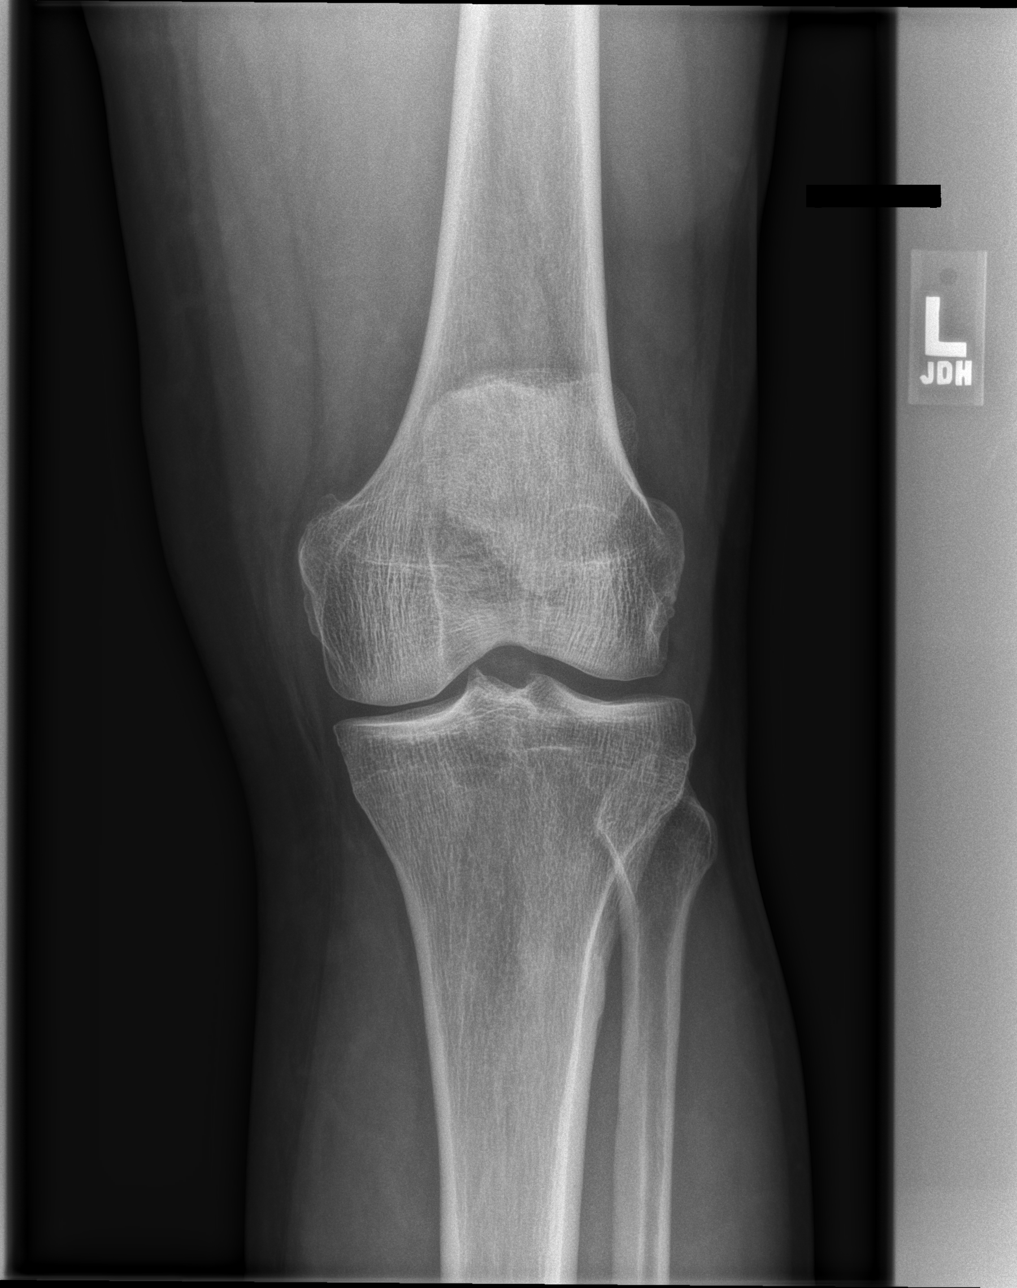

[w knee lat left]
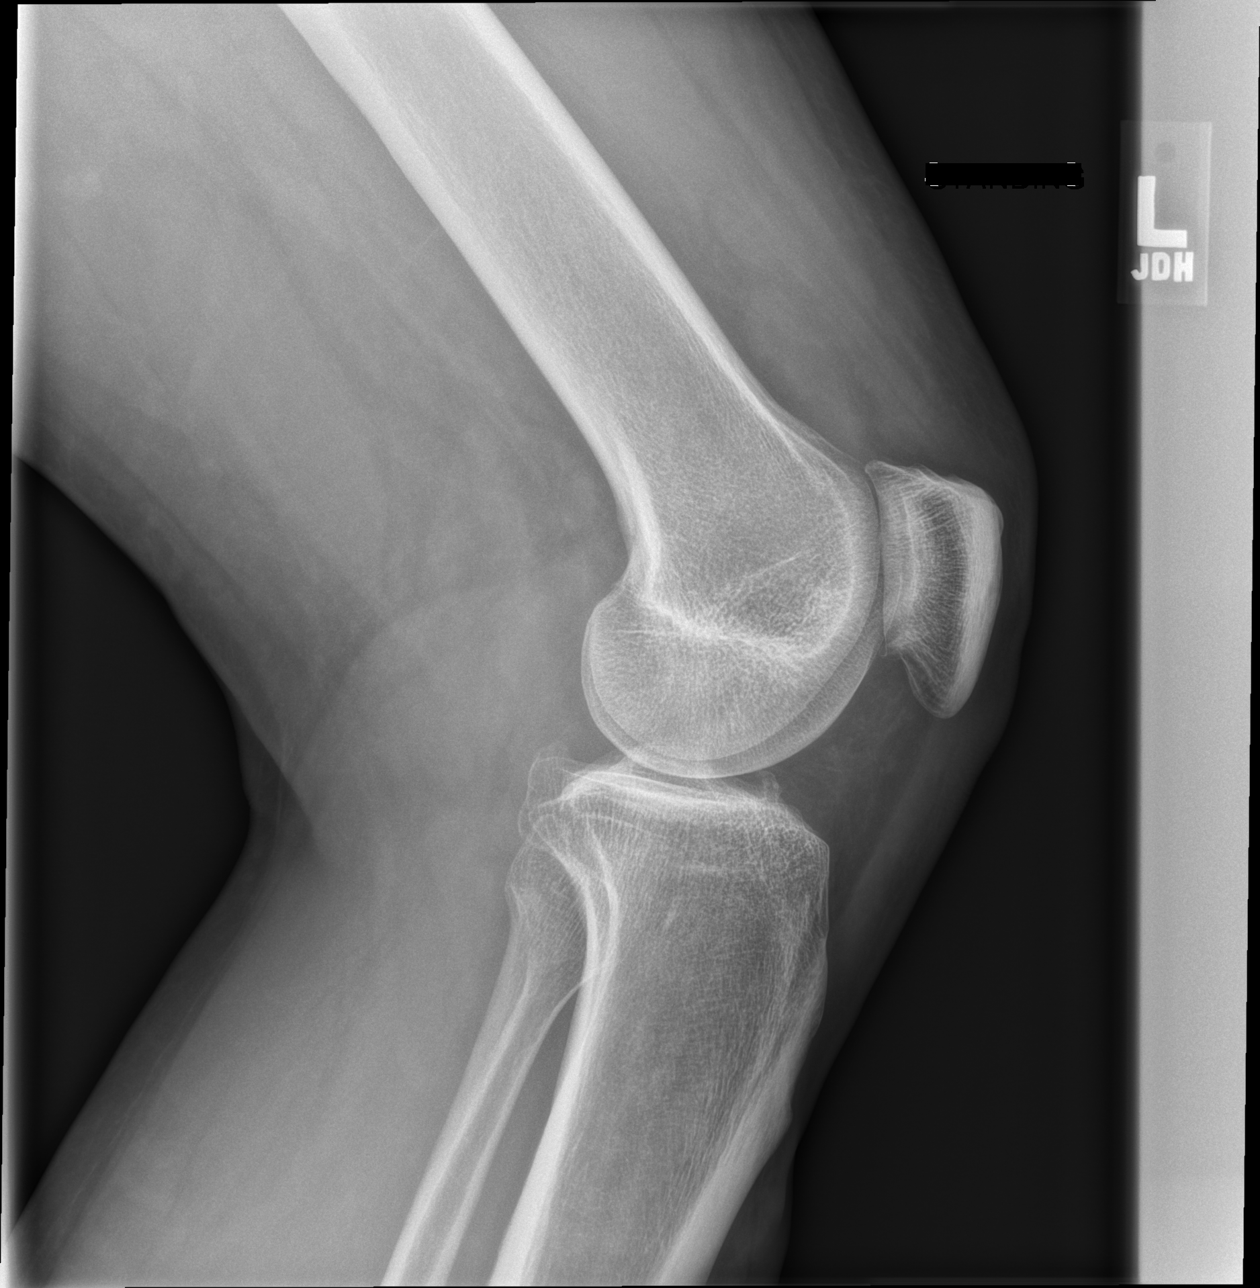

[x knee tunnel left]
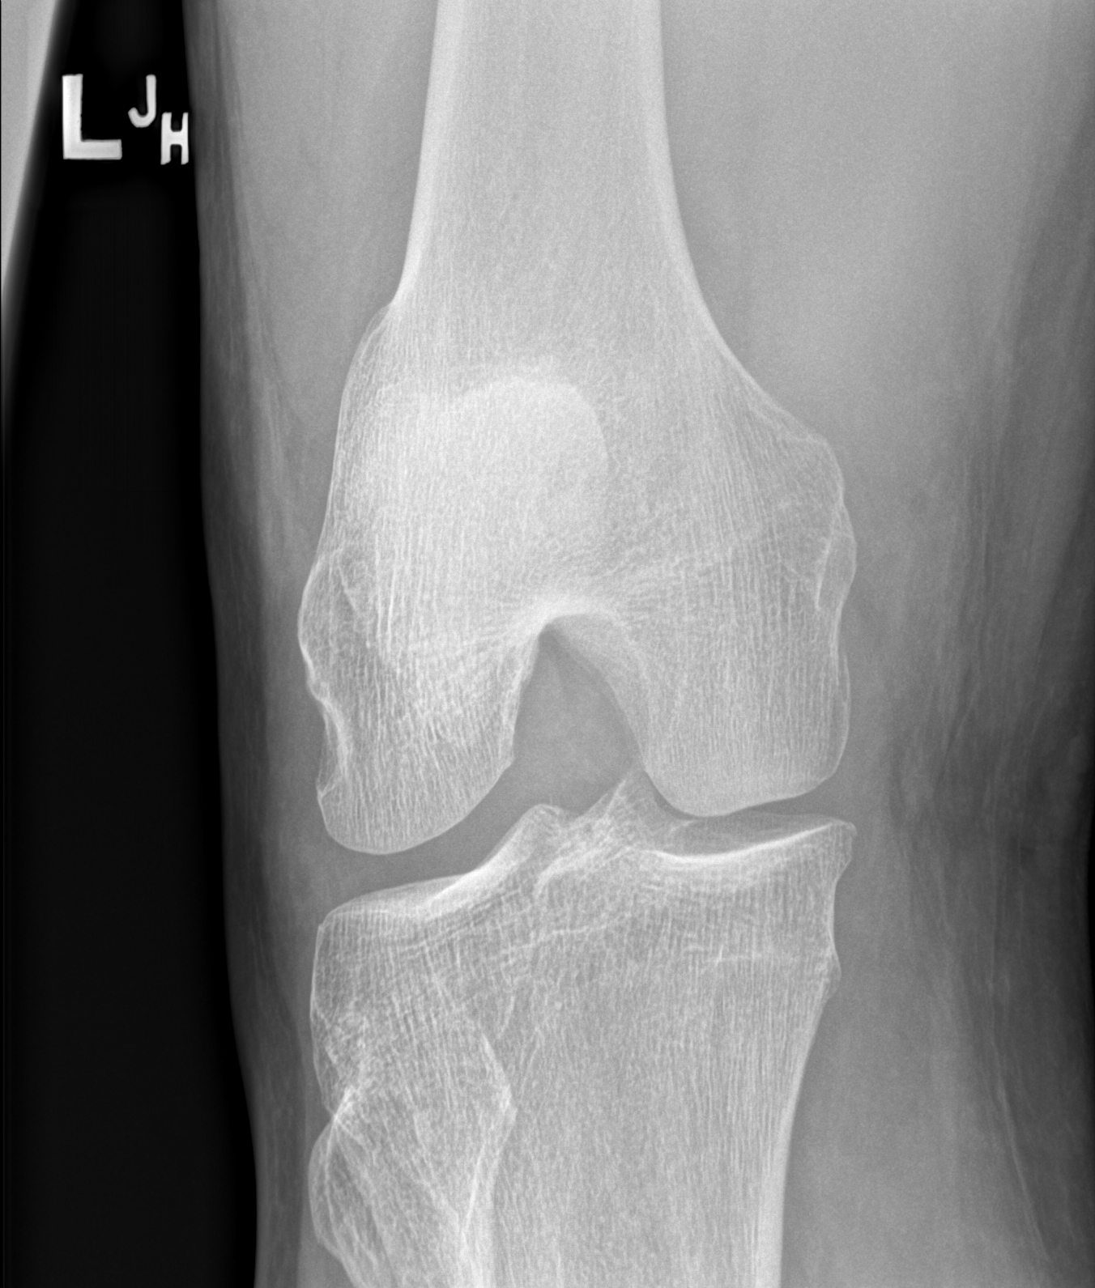

[x knee sunrise left]
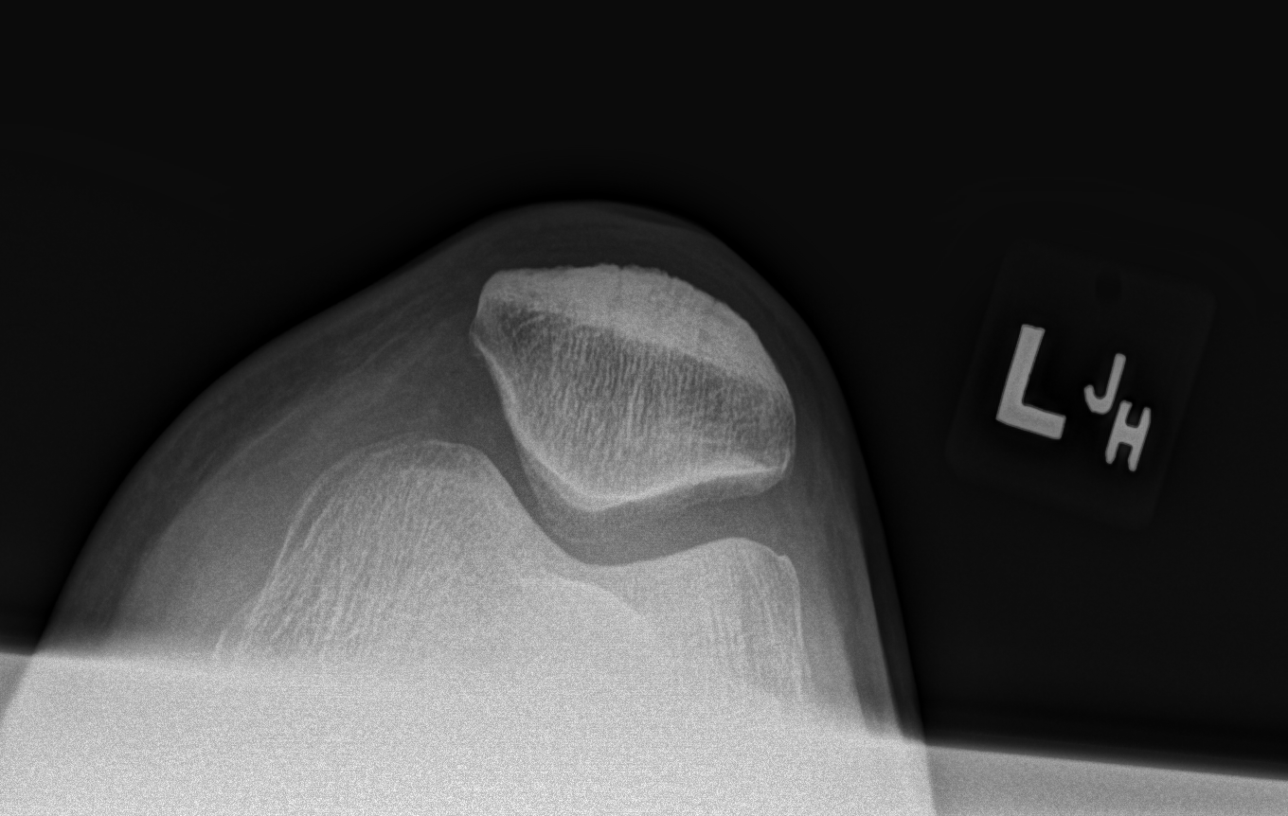

[4 of 4 positions shown; findings below may reference images not displayed]

FINDINGS: No evidence of fracture, dislocation, or joint effusion. Mild
narrowing of medial joint space is noted. Soft tissues are
unremarkable.
IMPRESSION: Mild degenerative joint disease is noted medially. No acute
abnormality seen in the left knee.

## 2018-06-26 DIAGNOSIS — E1100 Type 2 diabetes mellitus with hyperosmolarity without nonketotic hyperglycemic-hyperosmolar coma (NKHHC): Secondary | ICD-10-CM | POA: Diagnosis not present

## 2018-06-26 DIAGNOSIS — I10 Essential (primary) hypertension: Secondary | ICD-10-CM | POA: Diagnosis not present

## 2018-09-17 DIAGNOSIS — E119 Type 2 diabetes mellitus without complications: Secondary | ICD-10-CM | POA: Diagnosis not present

## 2018-10-27 DIAGNOSIS — E1100 Type 2 diabetes mellitus with hyperosmolarity without nonketotic hyperglycemic-hyperosmolar coma (NKHHC): Secondary | ICD-10-CM | POA: Diagnosis not present

## 2018-10-27 DIAGNOSIS — M130 Polyarthritis, unspecified: Secondary | ICD-10-CM | POA: Diagnosis not present

## 2018-10-27 DIAGNOSIS — I10 Essential (primary) hypertension: Secondary | ICD-10-CM | POA: Diagnosis not present

## 2018-10-27 DIAGNOSIS — E782 Mixed hyperlipidemia: Secondary | ICD-10-CM | POA: Diagnosis not present

## 2018-10-27 DIAGNOSIS — Z0001 Encounter for general adult medical examination with abnormal findings: Secondary | ICD-10-CM | POA: Diagnosis not present

## 2018-10-27 DIAGNOSIS — E118 Type 2 diabetes mellitus with unspecified complications: Secondary | ICD-10-CM | POA: Diagnosis not present

## 2018-12-21 DIAGNOSIS — M7918 Myalgia, other site: Secondary | ICD-10-CM | POA: Diagnosis not present

## 2018-12-21 DIAGNOSIS — Z6829 Body mass index (BMI) 29.0-29.9, adult: Secondary | ICD-10-CM | POA: Diagnosis not present

## 2018-12-21 DIAGNOSIS — R799 Abnormal finding of blood chemistry, unspecified: Secondary | ICD-10-CM | POA: Diagnosis not present

## 2018-12-21 DIAGNOSIS — A084 Viral intestinal infection, unspecified: Secondary | ICD-10-CM | POA: Diagnosis not present

## 2018-12-21 DIAGNOSIS — E785 Hyperlipidemia, unspecified: Secondary | ICD-10-CM | POA: Diagnosis not present

## 2019-02-25 DIAGNOSIS — Z6829 Body mass index (BMI) 29.0-29.9, adult: Secondary | ICD-10-CM | POA: Diagnosis not present

## 2019-02-25 DIAGNOSIS — E1169 Type 2 diabetes mellitus with other specified complication: Secondary | ICD-10-CM | POA: Diagnosis not present

## 2019-02-25 DIAGNOSIS — E785 Hyperlipidemia, unspecified: Secondary | ICD-10-CM | POA: Diagnosis not present

## 2019-02-25 DIAGNOSIS — I10 Essential (primary) hypertension: Secondary | ICD-10-CM | POA: Diagnosis not present

## 2019-07-01 DIAGNOSIS — R7309 Other abnormal glucose: Secondary | ICD-10-CM | POA: Diagnosis not present

## 2019-07-01 DIAGNOSIS — E1169 Type 2 diabetes mellitus with other specified complication: Secondary | ICD-10-CM | POA: Diagnosis not present

## 2019-07-01 DIAGNOSIS — I10 Essential (primary) hypertension: Secondary | ICD-10-CM | POA: Diagnosis not present

## 2019-08-18 DIAGNOSIS — M2141 Flat foot [pes planus] (acquired), right foot: Secondary | ICD-10-CM | POA: Diagnosis not present

## 2019-08-18 DIAGNOSIS — M722 Plantar fascial fibromatosis: Secondary | ICD-10-CM | POA: Diagnosis not present

## 2019-08-18 DIAGNOSIS — M2041 Other hammer toe(s) (acquired), right foot: Secondary | ICD-10-CM | POA: Diagnosis not present

## 2019-08-18 DIAGNOSIS — E119 Type 2 diabetes mellitus without complications: Secondary | ICD-10-CM | POA: Diagnosis not present

## 2019-09-22 DIAGNOSIS — L730 Acne keloid: Secondary | ICD-10-CM | POA: Diagnosis not present

## 2019-09-22 DIAGNOSIS — E119 Type 2 diabetes mellitus without complications: Secondary | ICD-10-CM | POA: Diagnosis not present

## 2019-10-30 DIAGNOSIS — E1100 Type 2 diabetes mellitus with hyperosmolarity without nonketotic hyperglycemic-hyperosmolar coma (NKHHC): Secondary | ICD-10-CM | POA: Diagnosis not present

## 2019-10-30 DIAGNOSIS — E118 Type 2 diabetes mellitus with unspecified complications: Secondary | ICD-10-CM | POA: Diagnosis not present

## 2019-10-30 DIAGNOSIS — I10 Essential (primary) hypertension: Secondary | ICD-10-CM | POA: Diagnosis not present

## 2019-10-30 DIAGNOSIS — R972 Elevated prostate specific antigen [PSA]: Secondary | ICD-10-CM | POA: Diagnosis not present

## 2019-10-30 DIAGNOSIS — E782 Mixed hyperlipidemia: Secondary | ICD-10-CM | POA: Diagnosis not present

## 2019-10-30 DIAGNOSIS — E1169 Type 2 diabetes mellitus with other specified complication: Secondary | ICD-10-CM | POA: Diagnosis not present

## 2019-11-04 DIAGNOSIS — E782 Mixed hyperlipidemia: Secondary | ICD-10-CM | POA: Diagnosis not present

## 2019-11-04 DIAGNOSIS — I10 Essential (primary) hypertension: Secondary | ICD-10-CM | POA: Diagnosis not present

## 2019-11-04 DIAGNOSIS — Z0000 Encounter for general adult medical examination without abnormal findings: Secondary | ICD-10-CM | POA: Diagnosis not present

## 2019-11-04 DIAGNOSIS — E1169 Type 2 diabetes mellitus with other specified complication: Secondary | ICD-10-CM | POA: Diagnosis not present

## 2019-12-10 DIAGNOSIS — Z20828 Contact with and (suspected) exposure to other viral communicable diseases: Secondary | ICD-10-CM | POA: Diagnosis not present

## 2019-12-10 DIAGNOSIS — Z6836 Body mass index (BMI) 36.0-36.9, adult: Secondary | ICD-10-CM | POA: Diagnosis not present

## 2020-02-29 DIAGNOSIS — E785 Hyperlipidemia, unspecified: Secondary | ICD-10-CM | POA: Diagnosis not present

## 2020-02-29 DIAGNOSIS — M130 Polyarthritis, unspecified: Secondary | ICD-10-CM | POA: Diagnosis not present

## 2020-02-29 DIAGNOSIS — E1169 Type 2 diabetes mellitus with other specified complication: Secondary | ICD-10-CM | POA: Diagnosis not present

## 2020-02-29 DIAGNOSIS — I10 Essential (primary) hypertension: Secondary | ICD-10-CM | POA: Diagnosis not present

## 2020-06-23 DIAGNOSIS — E785 Hyperlipidemia, unspecified: Secondary | ICD-10-CM | POA: Diagnosis not present

## 2020-06-23 DIAGNOSIS — I10 Essential (primary) hypertension: Secondary | ICD-10-CM | POA: Diagnosis not present

## 2020-06-23 DIAGNOSIS — E1169 Type 2 diabetes mellitus with other specified complication: Secondary | ICD-10-CM | POA: Diagnosis not present

## 2020-07-03 DIAGNOSIS — M130 Polyarthritis, unspecified: Secondary | ICD-10-CM | POA: Diagnosis not present

## 2020-07-03 DIAGNOSIS — E785 Hyperlipidemia, unspecified: Secondary | ICD-10-CM | POA: Diagnosis not present

## 2020-07-03 DIAGNOSIS — E1169 Type 2 diabetes mellitus with other specified complication: Secondary | ICD-10-CM | POA: Diagnosis not present

## 2020-08-07 DIAGNOSIS — M545 Low back pain: Secondary | ICD-10-CM | POA: Diagnosis not present

## 2020-08-14 DIAGNOSIS — M545 Low back pain: Secondary | ICD-10-CM | POA: Diagnosis not present

## 2020-08-18 DIAGNOSIS — M722 Plantar fascial fibromatosis: Secondary | ICD-10-CM | POA: Diagnosis not present

## 2020-08-18 DIAGNOSIS — M2041 Other hammer toe(s) (acquired), right foot: Secondary | ICD-10-CM | POA: Diagnosis not present

## 2020-08-18 DIAGNOSIS — M2141 Flat foot [pes planus] (acquired), right foot: Secondary | ICD-10-CM | POA: Diagnosis not present

## 2020-08-18 DIAGNOSIS — E119 Type 2 diabetes mellitus without complications: Secondary | ICD-10-CM | POA: Diagnosis not present

## 2020-10-19 DIAGNOSIS — H40013 Open angle with borderline findings, low risk, bilateral: Secondary | ICD-10-CM | POA: Diagnosis not present

## 2020-11-03 DIAGNOSIS — Z125 Encounter for screening for malignant neoplasm of prostate: Secondary | ICD-10-CM | POA: Diagnosis not present

## 2020-11-03 DIAGNOSIS — M130 Polyarthritis, unspecified: Secondary | ICD-10-CM | POA: Diagnosis not present

## 2020-11-03 DIAGNOSIS — E785 Hyperlipidemia, unspecified: Secondary | ICD-10-CM | POA: Diagnosis not present

## 2020-11-03 DIAGNOSIS — I10 Essential (primary) hypertension: Secondary | ICD-10-CM | POA: Diagnosis not present

## 2020-11-03 DIAGNOSIS — R7309 Other abnormal glucose: Secondary | ICD-10-CM | POA: Diagnosis not present

## 2020-11-03 DIAGNOSIS — M5440 Lumbago with sciatica, unspecified side: Secondary | ICD-10-CM | POA: Diagnosis not present

## 2020-11-24 DIAGNOSIS — Z1159 Encounter for screening for other viral diseases: Secondary | ICD-10-CM | POA: Diagnosis not present

## 2020-11-28 DIAGNOSIS — D120 Benign neoplasm of cecum: Secondary | ICD-10-CM | POA: Diagnosis not present

## 2020-11-28 DIAGNOSIS — Z86010 Personal history of colonic polyps: Secondary | ICD-10-CM | POA: Diagnosis not present

## 2020-11-30 DIAGNOSIS — E1169 Type 2 diabetes mellitus with other specified complication: Secondary | ICD-10-CM | POA: Diagnosis not present

## 2020-11-30 DIAGNOSIS — R972 Elevated prostate specific antigen [PSA]: Secondary | ICD-10-CM | POA: Diagnosis not present

## 2020-11-30 DIAGNOSIS — Z0000 Encounter for general adult medical examination without abnormal findings: Secondary | ICD-10-CM | POA: Diagnosis not present

## 2020-11-30 DIAGNOSIS — M25551 Pain in right hip: Secondary | ICD-10-CM | POA: Diagnosis not present

## 2020-12-08 DIAGNOSIS — M545 Low back pain, unspecified: Secondary | ICD-10-CM | POA: Diagnosis not present

## 2020-12-08 DIAGNOSIS — M5441 Lumbago with sciatica, right side: Secondary | ICD-10-CM | POA: Diagnosis not present

## 2020-12-25 DIAGNOSIS — M5441 Lumbago with sciatica, right side: Secondary | ICD-10-CM | POA: Diagnosis not present

## 2020-12-25 DIAGNOSIS — M545 Low back pain, unspecified: Secondary | ICD-10-CM | POA: Diagnosis not present

## 2021-01-05 DIAGNOSIS — H9192 Unspecified hearing loss, left ear: Secondary | ICD-10-CM | POA: Diagnosis not present

## 2021-01-05 DIAGNOSIS — H6122 Impacted cerumen, left ear: Secondary | ICD-10-CM | POA: Diagnosis not present

## 2021-01-22 DIAGNOSIS — M25551 Pain in right hip: Secondary | ICD-10-CM | POA: Diagnosis not present

## 2021-01-22 DIAGNOSIS — M545 Low back pain, unspecified: Secondary | ICD-10-CM | POA: Diagnosis not present

## 2021-01-22 DIAGNOSIS — M5441 Lumbago with sciatica, right side: Secondary | ICD-10-CM | POA: Diagnosis not present

## 2021-01-25 DIAGNOSIS — M545 Low back pain, unspecified: Secondary | ICD-10-CM | POA: Diagnosis not present

## 2021-01-30 DIAGNOSIS — M5441 Lumbago with sciatica, right side: Secondary | ICD-10-CM | POA: Diagnosis not present

## 2021-01-30 DIAGNOSIS — M545 Low back pain, unspecified: Secondary | ICD-10-CM | POA: Diagnosis not present

## 2021-02-13 DIAGNOSIS — M5416 Radiculopathy, lumbar region: Secondary | ICD-10-CM | POA: Diagnosis not present

## 2021-02-13 DIAGNOSIS — M5441 Lumbago with sciatica, right side: Secondary | ICD-10-CM | POA: Diagnosis not present

## 2021-02-13 DIAGNOSIS — R531 Weakness: Secondary | ICD-10-CM | POA: Diagnosis not present

## 2021-02-22 DIAGNOSIS — M5441 Lumbago with sciatica, right side: Secondary | ICD-10-CM | POA: Diagnosis not present

## 2021-02-22 DIAGNOSIS — R531 Weakness: Secondary | ICD-10-CM | POA: Diagnosis not present

## 2021-02-28 DIAGNOSIS — M5416 Radiculopathy, lumbar region: Secondary | ICD-10-CM | POA: Diagnosis not present

## 2021-02-28 DIAGNOSIS — R531 Weakness: Secondary | ICD-10-CM | POA: Diagnosis not present

## 2021-02-28 DIAGNOSIS — M5441 Lumbago with sciatica, right side: Secondary | ICD-10-CM | POA: Diagnosis not present

## 2021-03-08 DIAGNOSIS — R531 Weakness: Secondary | ICD-10-CM | POA: Diagnosis not present

## 2021-03-08 DIAGNOSIS — M5441 Lumbago with sciatica, right side: Secondary | ICD-10-CM | POA: Diagnosis not present

## 2021-03-13 DIAGNOSIS — N529 Male erectile dysfunction, unspecified: Secondary | ICD-10-CM | POA: Diagnosis not present

## 2021-03-16 DIAGNOSIS — M5416 Radiculopathy, lumbar region: Secondary | ICD-10-CM | POA: Diagnosis not present

## 2021-03-23 DIAGNOSIS — M5416 Radiculopathy, lumbar region: Secondary | ICD-10-CM | POA: Diagnosis not present

## 2021-04-03 ENCOUNTER — Other Ambulatory Visit: Payer: Self-pay | Admitting: Orthopedic Surgery

## 2021-04-05 DIAGNOSIS — I10 Essential (primary) hypertension: Secondary | ICD-10-CM | POA: Diagnosis not present

## 2021-04-05 DIAGNOSIS — E785 Hyperlipidemia, unspecified: Secondary | ICD-10-CM | POA: Diagnosis not present

## 2021-04-05 DIAGNOSIS — E1169 Type 2 diabetes mellitus with other specified complication: Secondary | ICD-10-CM | POA: Diagnosis not present

## 2021-05-01 NOTE — Progress Notes (Signed)
Surgical Instructions    Your procedure is scheduled on 05/03/21.  Report to [facility] Main Entrance "A" at 08:30 A.M., then check in with the Admitting office.  Call this number if you have problems the morning of surgery:  [phone]   If you have any questions prior to your surgery date call [phone]: Open Monday-Friday 8am-4pm    Remember:  Do not eat after midnight the night before your surgery  You may drink clear liquids until 08:30am the morning of your surgery.   Clear liquids allowed are: Water, Non-Citrus Juices (without pulp), Carbonated Beverages, Clear Tea, Black Coffee Only, and Gatorade  Patient Instructions  . The night before surgery:  o No food after midnight. ONLY clear liquids after midnight  . The day of surgery (if you have diabetes): o Drink ONE (1) 10 oz water bottle given to you in your pre admission testing appointment by 08:30am the morning of surgery. Drink in one sitting. Do not sip.  o This drink was given to you during your hospital  pre-op appointment visit.  o Nothing else to drink after completing the  10 oz bottle of water.          If you have questions, please contact your surgeon's office.     Take these medicines the morning of surgery with A SIP OF WATER: NONE   As of today, STOP taking any Aspirin (unless otherwise instructed by your surgeon) Aleve, Naproxen, Ibuprofen, Motrin, Advil, Goody's, BC's, all herbal medications, fish oil, and all vitamins.   WHAT DO I DO ABOUT MY DIABETES MEDICATION?   Marland Kitchen Do not take oral diabetes medicines (pills) the morning of surgery.       . THE MORNING OF SURGERY, do not take JANUMET.  . The day of surgery, do not take other diabetes injectables, including Byetta (exenatide), Bydureon (exenatide ER), Victoza (liraglutide), or Trulicity (dulaglutide).  . If your CBG is greater than 220 mg/dL, you may take  of your sliding scale (correction) dose of insulin.   HOW TO MANAGE YOUR  DIABETES BEFORE AND AFTER SURGERY  Why is it important to control my blood sugar before and after surgery? . Improving blood sugar levels before and after surgery helps healing and can limit problems. . A way of improving blood sugar control is eating a healthy diet by: o  Eating less sugar and carbohydrates o  Increasing activity/exercise o  Talking with your doctor about reaching your blood sugar goals . High blood sugars (greater than 180 mg/dL) can raise your risk of infections and slow your recovery, so you will need to focus on controlling your diabetes during the weeks before surgery. . Make sure that the doctor who takes care of your diabetes knows about your planned surgery including the date and location.  How do I manage my blood sugar before surgery? . Check your blood sugar at least 4 times a day, starting 2 days before surgery, to make sure that the level is not too high or low. . Check your blood sugar the morning of your surgery when you wake up and every 2 hours until you get to the Short Stay unit. o If your blood sugar is less than 70 mg/dL, you will need to treat for low blood sugar: - Do not take insulin. - Treat a low blood sugar (less than 70 mg/dL) with  cup of clear juice (cranberry or apple), 4 glucose tablets, OR glucose gel. - Recheck blood sugar in 15 minutes  after treatment (to make sure it is greater than 70 mg/dL). If your blood sugar is not greater than 70 mg/dL on recheck, call [phone] for further instructions. . Report your blood sugar to the short stay nurse when you get to Short Stay.  . If you are admitted to the hospital after surgery: o Your blood sugar will be checked by the staff and you will probably be given insulin after surgery (instead of oral diabetes medicines) to make sure you have good blood sugar levels. o The goal for blood sugar control after surgery is 80-180 mg/dL.                      Do not wear jewelry, make up, or nail  polish            Do not wear lotions, powders, perfumes, or deodorant.            Men may shave face and neck.            Do not bring valuables to the hospital.            [facility] is not responsible for any belongings or valuables.  Do NOT Smoke (Tobacco/Vaping) or drink Alcohol 24 hours prior to your procedure If you use a CPAP at night, you may bring all equipment for your overnight stay.   Contacts, glasses, dentures or bridgework may not be worn into surgery, please bring cases for these belongings   For patients admitted to the hospital, discharge time will be determined by your treatment team.   Patients discharged the day of surgery will not be allowed to drive home, and someone needs to stay with them for 24 hours.    Special instructions:   [facility]- Preparing For Surgery  Before surgery, you can play an important role. Because skin is not sterile, your skin needs to be as free of germs as possible. You can reduce the number of germs on your skin by washing with CHG (chlorahexidine gluconate) Soap before surgery.  CHG is an antiseptic cleaner which kills germs and bonds with the skin to continue killing germs even after washing.    Oral Hygiene is also important to reduce your risk of infection.  Remember - BRUSH YOUR TEETH THE MORNING OF SURGERY WITH YOUR REGULAR TOOTHPASTE  Please do not use if you have an allergy to CHG or antibacterial soaps. If your skin becomes reddened/irritated stop using the CHG.  Do not shave (including legs and underarms) for at least 48 hours prior to first CHG shower. It is OK to shave your face.  Please follow these instructions carefully.   1. Shower the NIGHT BEFORE SURGERY and the MORNING OF SURGERY  2. If you chose to wash your hair, wash your hair first as usual with your normal shampoo.  3. After you shampoo, rinse your hair and body thoroughly to remove the shampoo.  4. Wash Face and genitals (private parts) with your normal  soap.   5.  Shower the NIGHT BEFORE SURGERY and the MORNING OF SURGERY with CHG Soap.   6. Use CHG Soap as you would any other liquid soap. You can apply CHG directly to the skin and wash gently with a scrungie or a clean washcloth.   7. Apply the CHG Soap to your body ONLY FROM THE NECK DOWN.  Do not use on open wounds or open sores. Avoid contact with your eyes, ears, mouth and genitals (private  parts). Wash Face and genitals (private parts)  with your normal soap.   8. Wash thoroughly, paying special attention to the area where your surgery will be performed.  9. Thoroughly rinse your body with warm water from the neck down.  10. DO NOT shower/wash with your normal soap after using and rinsing off the CHG Soap.  11. Pat yourself dry with a CLEAN TOWEL.  12. Wear CLEAN PAJAMAS to bed the night before surgery  13. Place CLEAN SHEETS on your bed the night before your surgery  14. DO NOT SLEEP WITH PETS.   Day of Surgery: Take a shower with CHG soap. Wear Clean/Comfortable clothing the morning of surgery Do not apply any deodorants/lotions.   Remember to brush your teeth WITH YOUR REGULAR TOOTHPASTE.   Please read over the following fact sheets that you were given.

## 2021-05-02 ENCOUNTER — Other Ambulatory Visit: Payer: Self-pay

## 2021-05-02 ENCOUNTER — Encounter (HOSPITAL_COMMUNITY)
Admission: RE | Admit: 2021-05-02 | Discharge: 2021-05-02 | Disposition: A | Discharge status: Home / Self Care | Payer: BC Managed Care – PPO | Source: Ambulatory Visit | Attending: Orthopedic Surgery | Admitting: Orthopedic Surgery

## 2021-05-02 ENCOUNTER — Encounter (HOSPITAL_COMMUNITY): Payer: Self-pay

## 2021-05-02 DIAGNOSIS — M5117 Intervertebral disc disorders with radiculopathy, lumbosacral region: Secondary | ICD-10-CM | POA: Diagnosis not present

## 2021-05-02 DIAGNOSIS — E119 Type 2 diabetes mellitus without complications: Secondary | ICD-10-CM | POA: Diagnosis not present

## 2021-05-02 DIAGNOSIS — M5116 Intervertebral disc disorders with radiculopathy, lumbar region: Secondary | ICD-10-CM | POA: Diagnosis not present

## 2021-05-02 DIAGNOSIS — Z79899 Other long term (current) drug therapy: Secondary | ICD-10-CM | POA: Diagnosis not present

## 2021-05-02 DIAGNOSIS — Z01818 Encounter for other preprocedural examination: Secondary | ICD-10-CM | POA: Insufficient documentation

## 2021-05-02 DIAGNOSIS — Z20822 Contact with and (suspected) exposure to covid-19: Secondary | ICD-10-CM | POA: Diagnosis not present

## 2021-05-02 DIAGNOSIS — M5416 Radiculopathy, lumbar region: Secondary | ICD-10-CM | POA: Diagnosis not present

## 2021-05-02 LAB — APTT: aPTT: 28 seconds (ref 24–36)

## 2021-05-02 LAB — COMPREHENSIVE METABOLIC PANEL
ALT: 25 U/L (ref 0–44)
AST: 21 U/L (ref 15–41)
Albumin: 4 g/dL (ref 3.5–5.0)
Alkaline Phosphatase: 31 U/L — ABNORMAL LOW (ref 38–126)
Anion gap: 6 (ref 5–15)
BUN: 13 mg/dL (ref 6–20)
CO2: 27 mmol/L (ref 22–32)
Calcium: 9.2 mg/dL (ref 8.9–10.3)
Chloride: 104 mmol/L (ref 98–111)
Creatinine, Ser: 1.04 mg/dL (ref 0.61–1.24)
GFR, Estimated: >60 mL/min (ref >60)
Glucose, Bld: 96 mg/dL (ref 70–99)
Potassium: 4 mmol/L (ref 3.5–5.1)
Sodium: 137 mmol/L (ref 135–145)
Total Bilirubin: 0.5 mg/dL (ref 0.3–1.2)
Total Protein: 6.7 g/dL (ref 6.5–8.1)

## 2021-05-02 LAB — URINALYSIS, ROUTINE W REFLEX MICROSCOPIC
Bilirubin Urine: NEGATIVE
Glucose, UA: NEGATIVE mg/dL
Hgb urine dipstick: NEGATIVE
Ketones, ur: NEGATIVE mg/dL
Leukocytes,Ua: NEGATIVE
Nitrite: NEGATIVE
Protein, ur: NEGATIVE mg/dL
Specific Gravity, Urine: 1.015 (ref 1.005–1.030)
pH: 7 (ref 5.0–8.0)

## 2021-05-02 LAB — CBC WITH DIFFERENTIAL/PLATELET
Abs Immature Granulocytes: 0.03 10*3/uL (ref 0.00–0.07)
Basophils Absolute: 0 10*3/uL (ref 0.0–0.1)
Basophils Relative: 1 %
Eosinophils Absolute: 0.1 10*3/uL (ref 0.0–0.5)
Eosinophils Relative: 2 %
HCT: 45.4 % (ref 39.0–52.0)
Hemoglobin: 15.5 g/dL (ref 13.0–17.0)
Immature Granulocytes: 1 %
Lymphocytes Relative: 43 %
Lymphs Abs: 1.9 10*3/uL (ref 0.7–4.0)
MCH: 30.3 pg (ref 26.0–34.0)
MCHC: 34.1 g/dL (ref 30.0–36.0)
MCV: 88.8 fL (ref 80.0–100.0)
Monocytes Absolute: 0.6 10*3/uL (ref 0.1–1.0)
Monocytes Relative: 14 %
Neutro Abs: 1.7 10*3/uL (ref 1.7–7.7)
Neutrophils Relative %: 39 %
Platelets: 257 10*3/uL (ref 150–400)
RBC: 5.11 MIL/uL (ref 4.22–5.81)
RDW: 13.8 % (ref 11.5–15.5)
WBC: 4.4 10*3/uL (ref 4.0–10.5)
nRBC: 0 % (ref 0.0–0.2)

## 2021-05-02 LAB — SURGICAL PCR SCREEN
MRSA, PCR: NEGATIVE
Staphylococcus aureus: NEGATIVE

## 2021-05-02 LAB — HEMOGLOBIN A1C
Hgb A1c MFr Bld: 5.5 % (ref 4.8–5.6)
Mean Plasma Glucose: 111.15 mg/dL

## 2021-05-02 LAB — PROTIME-INR
INR: 0.9 (ref 0.8–1.2)
Prothrombin Time: 12.5 seconds (ref 11.4–15.2)

## 2021-05-02 LAB — TYPE AND SCREEN
ABO/RH(D): A POS
Antibody Screen: NEGATIVE

## 2021-05-02 LAB — GLUCOSE, CAPILLARY: Glucose-Capillary: 98 mg/dL (ref 70–99)

## 2021-05-02 LAB — SARS CORONAVIRUS 2 (TAT 6-24 HRS): SARS Coronavirus 2: NEGATIVE

## 2021-05-02 NOTE — Progress Notes (Addendum)
PCP - Renaye Rakers Cardiologist - denies  PPM/ICD - denies   Chest x-ray -n/a  EKG - 05/02/21 Stress Test - records requested (ordered by dr [facility] ECHO - denies Cardiac Cath - denies  Sleep Study - denies   Fasting Blood Sugar - does not check at home and does not have a meter  Patient instructed to hold all Aspirin, NSAID's, herbal medications, fish oil and vitamins 7 days prior to surgery.  ERAS Protcol -yes PRE-SURGERY Ensure or G2- water given  COVID TEST- 05/02/21   Anesthesia review: yes, history of normal stress test per patient. Records requested from [facility]   Patient denies shortness of breath, fever, cough and chest pain at PAT appointment   All instructions explained to the patient, with a verbal understanding of the material. Patient agrees to go over the instructions while at home for a better understanding. Patient also instructed to self quarantine after being tested for COVID-19. The opportunity to ask questions was provided.

## 2021-05-03 ENCOUNTER — Ambulatory Visit (HOSPITAL_COMMUNITY): Payer: BC Managed Care – PPO | Admitting: Certified Registered"

## 2021-05-03 ENCOUNTER — Ambulatory Visit (HOSPITAL_COMMUNITY)
Admission: RE | Admit: 2021-05-03 | Discharge: 2021-05-03 | Disposition: A | Discharge status: Home / Self Care | Payer: BC Managed Care – PPO | Attending: Orthopedic Surgery | Admitting: Orthopedic Surgery

## 2021-05-03 ENCOUNTER — Ambulatory Visit (HOSPITAL_COMMUNITY): Payer: BC Managed Care – PPO

## 2021-05-03 ENCOUNTER — Encounter (HOSPITAL_COMMUNITY)
Admission: RE | Disposition: A | Discharge status: Home / Self Care | Payer: Self-pay | Source: Home / Self Care | Attending: Orthopedic Surgery

## 2021-05-03 ENCOUNTER — Encounter (HOSPITAL_COMMUNITY): Payer: Self-pay | Admitting: Orthopedic Surgery

## 2021-05-03 DIAGNOSIS — M5117 Intervertebral disc disorders with radiculopathy, lumbosacral region: Secondary | ICD-10-CM | POA: Insufficient documentation

## 2021-05-03 DIAGNOSIS — M5127 Other intervertebral disc displacement, lumbosacral region: Secondary | ICD-10-CM | POA: Diagnosis not present

## 2021-05-03 DIAGNOSIS — Z20822 Contact with and (suspected) exposure to covid-19: Secondary | ICD-10-CM | POA: Insufficient documentation

## 2021-05-03 DIAGNOSIS — Z419 Encounter for procedure for purposes other than remedying health state, unspecified: Secondary | ICD-10-CM

## 2021-05-03 DIAGNOSIS — Z981 Arthrodesis status: Secondary | ICD-10-CM | POA: Diagnosis not present

## 2021-05-03 DIAGNOSIS — Z79899 Other long term (current) drug therapy: Secondary | ICD-10-CM | POA: Diagnosis not present

## 2021-05-03 DIAGNOSIS — E119 Type 2 diabetes mellitus without complications: Secondary | ICD-10-CM | POA: Diagnosis not present

## 2021-05-03 DIAGNOSIS — M5416 Radiculopathy, lumbar region: Secondary | ICD-10-CM | POA: Diagnosis not present

## 2021-05-03 DIAGNOSIS — M5116 Intervertebral disc disorders with radiculopathy, lumbar region: Secondary | ICD-10-CM | POA: Diagnosis not present

## 2021-05-03 HISTORY — PX: LUMBAR LAMINECTOMY/DECOMPRESSION MICRODISCECTOMY: SHX5026

## 2021-05-03 LAB — ABO/RH: ABO/RH(D): A POS

## 2021-05-03 LAB — GLUCOSE, CAPILLARY
Glucose-Capillary: 104 mg/dL — ABNORMAL HIGH (ref 70–99)
Glucose-Capillary: 95 mg/dL (ref 70–99)

## 2021-05-03 SURGERY — LUMBAR LAMINECTOMY/DECOMPRESSION MICRODISCECTOMY
Anesthesia: General | Site: Back | Laterality: Right

## 2021-05-03 MED ORDER — METHYLPREDNISOLONE ACETATE 40 MG/ML IJ SUSP
INTRAMUSCULAR | Status: DC | PRN
  Administered 2021-05-03: 40 mg

## 2021-05-03 MED ORDER — MIDAZOLAM HCL 2 MG/2ML IJ SOLN
INTRAMUSCULAR | Status: AC
  Filled 2021-05-03: qty 2

## 2021-05-03 MED ORDER — LACTATED RINGERS IV SOLN: INTRAVENOUS | Status: DC | PRN

## 2021-05-03 MED ORDER — PHENYLEPHRINE HCL-NACL 10-0.9 MG/250ML-% IV SOLN
INTRAVENOUS | Status: DC | PRN
  Administered 2021-05-03: 25 ug/min via INTRAVENOUS

## 2021-05-03 MED ORDER — FENTANYL CITRATE (PF) 250 MCG/5ML IJ SOLN
INTRAMUSCULAR | Status: AC
  Filled 2021-05-03: qty 5

## 2021-05-03 MED ORDER — POVIDONE-IODINE 7.5 % EX SOLN: Freq: Once | CUTANEOUS | Status: DC

## 2021-05-03 MED ORDER — DEXAMETHASONE SODIUM PHOSPHATE 10 MG/ML IJ SOLN
INTRAMUSCULAR | Status: AC
  Filled 2021-05-03: qty 1

## 2021-05-03 MED ORDER — PROPOFOL 10 MG/ML IV BOLUS
INTRAVENOUS | Status: AC
  Filled 2021-05-03: qty 20

## 2021-05-03 MED ORDER — CEFAZOLIN SODIUM-DEXTROSE 2-4 GM/100ML-% IV SOLN
INTRAVENOUS | Status: AC
  Filled 2021-05-03: qty 100

## 2021-05-03 MED ORDER — FENTANYL CITRATE (PF) 250 MCG/5ML IJ SOLN
INTRAMUSCULAR | Status: DC | PRN
  Administered 2021-05-03: 50 ug via INTRAVENOUS
  Administered 2021-05-03: 100 ug via INTRAVENOUS
  Administered 2021-05-03: 50 ug via INTRAVENOUS

## 2021-05-03 MED ORDER — DEXAMETHASONE SODIUM PHOSPHATE 10 MG/ML IJ SOLN
INTRAMUSCULAR | Status: DC | PRN
  Administered 2021-05-03: 5 mg via INTRAVENOUS

## 2021-05-03 MED ORDER — ROCURONIUM BROMIDE 10 MG/ML (PF) SYRINGE
PREFILLED_SYRINGE | INTRAVENOUS | Status: AC
  Filled 2021-05-03: qty 10

## 2021-05-03 MED ORDER — OXYCODONE HCL 5 MG PO TABS: 5.0000 mg | ORAL_TABLET | Freq: Once | ORAL | Status: DC | PRN

## 2021-05-03 MED ORDER — CEFAZOLIN SODIUM-DEXTROSE 2-4 GM/100ML-% IV SOLN
2.0000 g | INTRAVENOUS | Status: AC
  Administered 2021-05-03: 2 g via INTRAVENOUS

## 2021-05-03 MED ORDER — BUPIVACAINE LIPOSOME 1.3 % IJ SUSP
INTRAMUSCULAR | Status: AC
  Filled 2021-05-03: qty 20

## 2021-05-03 MED ORDER — 0.9 % SODIUM CHLORIDE (POUR BTL) OPTIME
TOPICAL | Status: DC | PRN
  Administered 2021-05-03: 1000 mL

## 2021-05-03 MED ORDER — HYDROCODONE-ACETAMINOPHEN 5-325 MG PO TABS: 1.0000 | ORAL_TABLET | Freq: Four times a day (QID) | ORAL | 0 refills | Status: AC | PRN

## 2021-05-03 MED ORDER — OXYCODONE HCL 5 MG/5ML PO SOLN: 5.0000 mg | Freq: Once | ORAL | Status: DC | PRN

## 2021-05-03 MED ORDER — ONDANSETRON HCL 4 MG/2ML IJ SOLN
INTRAMUSCULAR | Status: AC
  Filled 2021-05-03: qty 2

## 2021-05-03 MED ORDER — PROPOFOL 10 MG/ML IV BOLUS
INTRAVENOUS | Status: DC | PRN
  Administered 2021-05-03: 170 mg via INTRAVENOUS

## 2021-05-03 MED ORDER — CHLORHEXIDINE GLUCONATE 0.12 % MT SOLN
OROMUCOSAL | Status: AC
  Administered 2021-05-03: 15 mL via OROMUCOSAL
  Filled 2021-05-03: qty 15

## 2021-05-03 MED ORDER — ROCURONIUM BROMIDE 10 MG/ML (PF) SYRINGE
PREFILLED_SYRINGE | INTRAVENOUS | Status: DC | PRN
  Administered 2021-05-03: 70 mg via INTRAVENOUS

## 2021-05-03 MED ORDER — BUPIVACAINE-EPINEPHRINE 0.25% -1:200000 IJ SOLN
INTRAMUSCULAR | Status: DC | PRN
  Administered 2021-05-03: 4 mL
  Administered 2021-05-03: 20 mL

## 2021-05-03 MED ORDER — FENTANYL CITRATE (PF) 100 MCG/2ML IJ SOLN: 25.0000 ug | INTRAMUSCULAR | Status: DC | PRN

## 2021-05-03 MED ORDER — CHLORHEXIDINE GLUCONATE 0.12 % MT SOLN: 15.0000 mL | Freq: Once | OROMUCOSAL | Status: AC

## 2021-05-03 MED ORDER — METHYLPREDNISOLONE ACETATE 40 MG/ML IJ SUSP
INTRAMUSCULAR | Status: AC
  Filled 2021-05-03: qty 1

## 2021-05-03 MED ORDER — BUPIVACAINE-EPINEPHRINE (PF) 0.25% -1:200000 IJ SOLN
INTRAMUSCULAR | Status: AC
  Filled 2021-05-03: qty 30

## 2021-05-03 MED ORDER — METHYLENE BLUE 0.5 % INJ SOLN
INTRAVENOUS | Status: AC
  Filled 2021-05-03: qty 10

## 2021-05-03 MED ORDER — THROMBIN 20000 UNITS EX SOLR
CUTANEOUS | Status: AC
  Filled 2021-05-03: qty 20000

## 2021-05-03 MED ORDER — LIDOCAINE 2% (20 MG/ML) 5 ML SYRINGE
INTRAMUSCULAR | Status: DC | PRN
  Administered 2021-05-03: 60 mg via INTRAVENOUS

## 2021-05-03 MED ORDER — ACETAMINOPHEN 10 MG/ML IV SOLN: 1000.0000 mg | Freq: Once | INTRAVENOUS | Status: DC | PRN

## 2021-05-03 MED ORDER — ONDANSETRON HCL 4 MG/2ML IJ SOLN
INTRAMUSCULAR | Status: DC | PRN
  Administered 2021-05-03: 4 mg via INTRAVENOUS

## 2021-05-03 MED ORDER — ACETAMINOPHEN 160 MG/5ML PO SOLN: 1000.0000 mg | Freq: Once | ORAL | Status: DC | PRN

## 2021-05-03 MED ORDER — HEMOSTATIC AGENTS (NO CHARGE) OPTIME
TOPICAL | Status: DC | PRN
  Administered 2021-05-03: 1 via TOPICAL

## 2021-05-03 MED ORDER — LIDOCAINE 2% (20 MG/ML) 5 ML SYRINGE
INTRAMUSCULAR | Status: AC
  Filled 2021-05-03: qty 5

## 2021-05-03 MED ORDER — ORAL CARE MOUTH RINSE: 15.0000 mL | Freq: Once | OROMUCOSAL | Status: AC

## 2021-05-03 MED ORDER — THROMBIN 20000 UNITS EX SOLR: CUTANEOUS | Status: DC | PRN

## 2021-05-03 MED ORDER — BUPIVACAINE LIPOSOME 1.3 % IJ SUSP
INTRAMUSCULAR | Status: DC | PRN
  Administered 2021-05-03: 20 mL

## 2021-05-03 MED ORDER — SUGAMMADEX SODIUM 200 MG/2ML IV SOLN
INTRAVENOUS | Status: DC | PRN
  Administered 2021-05-03: 200 mg via INTRAVENOUS

## 2021-05-03 MED ORDER — METHYLENE BLUE 0.5 % INJ SOLN
INTRAVENOUS | Status: DC | PRN
  Administered 2021-05-03: .5 mL via INTRADERMAL

## 2021-05-03 MED ORDER — LACTATED RINGERS IV SOLN: INTRAVENOUS | Status: DC

## 2021-05-03 MED ORDER — MIDAZOLAM HCL 5 MG/5ML IJ SOLN
INTRAMUSCULAR | Status: DC | PRN
  Administered 2021-05-03: 2 mg via INTRAVENOUS

## 2021-05-03 MED ORDER — METHOCARBAMOL 500 MG PO TABS: 500.0000 mg | ORAL_TABLET | Freq: Four times a day (QID) | ORAL | 0 refills | Status: DC | PRN

## 2021-05-03 MED ORDER — ACETAMINOPHEN 500 MG PO TABS: 1000.0000 mg | ORAL_TABLET | Freq: Once | ORAL | Status: DC | PRN

## 2021-05-03 SURGICAL SUPPLY — 75 items
BENZOIN TINCTURE PRP APPL 2/3 (GAUZE/BANDAGES/DRESSINGS) ×2 IMPLANT
BNDG GAUZE ELAST 4 BULKY (GAUZE/BANDAGES/DRESSINGS) ×2 IMPLANT
BUR ROUND PRECISION 4.0 (BURR) ×2 IMPLANT
CABLE BIPOLOR RESECTION CORD (MISCELLANEOUS) IMPLANT
CANISTER SUCT 3000ML PPV (MISCELLANEOUS) ×2 IMPLANT
CARTRIDGE OIL MAESTRO DRILL (MISCELLANEOUS) ×1 IMPLANT
COVER SURGICAL LIGHT HANDLE (MISCELLANEOUS) IMPLANT
COVER WAND RF STERILE (DRAPES) IMPLANT
DIFFUSER DRILL AIR PNEUMATIC (MISCELLANEOUS) ×2 IMPLANT
DRAIN CHANNEL 15F RND FF W/TCR (WOUND CARE) IMPLANT
DRAPE POUCH INSTRU U-SHP 10X18 (DRAPES) IMPLANT
DRAPE SURG 17X23 STRL (DRAPES) ×8 IMPLANT
DURAPREP 26ML APPLICATOR (WOUND CARE) ×2 IMPLANT
ELECT BLADE 4.0 EZ CLEAN MEGAD (MISCELLANEOUS) ×2
ELECT CAUTERY BLADE 6.4 (BLADE) IMPLANT
ELECT REM PT RETURN 9FT ADLT (ELECTROSURGICAL) ×2
ELECTRODE BLDE 4.0 EZ CLN MEGD (MISCELLANEOUS) ×1 IMPLANT
ELECTRODE REM PT RTRN 9FT ADLT (ELECTROSURGICAL) ×1 IMPLANT
EVACUATOR SILICONE 100CC (DRAIN) IMPLANT
FILTER STRAW FLUID ASPIR (MISCELLANEOUS) ×2 IMPLANT
GAUZE 4X4 16PLY RFD (DISPOSABLE) IMPLANT
GAUZE SPONGE 4X4 12PLY STRL (GAUZE/BANDAGES/DRESSINGS) ×2 IMPLANT
GAUZE SPONGE 4X4 12PLY STRL LF (GAUZE/BANDAGES/DRESSINGS) ×2 IMPLANT
GLOVE BIO SURGEON STRL SZ7 (GLOVE) ×2 IMPLANT
GLOVE BIO SURGEON STRL SZ8 (GLOVE) ×2 IMPLANT
GLOVE SRG 8 PF TXTR STRL LF DI (GLOVE) ×1 IMPLANT
GLOVE SURG POLYISO LF SZ7 (GLOVE) ×8 IMPLANT
GLOVE SURG POLYISO LF SZ7.5 (GLOVE) ×4 IMPLANT
GLOVE SURG UNDER POLY LF SZ7 (GLOVE) ×4 IMPLANT
GLOVE SURG UNDER POLY LF SZ7.5 (GLOVE) IMPLANT
GLOVE SURG UNDER POLY LF SZ8 (GLOVE) ×2
GOWN STRL REUS W/ TWL LRG LVL3 (GOWN DISPOSABLE) ×3 IMPLANT
GOWN STRL REUS W/ TWL XL LVL3 (GOWN DISPOSABLE) ×1 IMPLANT
GOWN STRL REUS W/TWL LRG LVL3 (GOWN DISPOSABLE) ×6
GOWN STRL REUS W/TWL XL LVL3 (GOWN DISPOSABLE) ×2
IV CATH 14GX2 1/4 (CATHETERS) ×2 IMPLANT
KIT BASIN OR (CUSTOM PROCEDURE TRAY) ×2 IMPLANT
KIT POSITION SURG JACKSON T1 (MISCELLANEOUS) ×2 IMPLANT
KIT TURNOVER KIT B (KITS) ×2 IMPLANT
MARKER SKIN DUAL TIP RULER LAB (MISCELLANEOUS) IMPLANT
NEEDLE 18GX1X1/2 (RX/OR ONLY) (NEEDLE) ×2 IMPLANT
NEEDLE 22X1 1/2 (OR ONLY) (NEEDLE) ×2 IMPLANT
NEEDLE HYPO 25GX1X1/2 BEV (NEEDLE) ×2 IMPLANT
NEEDLE SPNL 18GX3.5 QUINCKE PK (NEEDLE) ×4 IMPLANT
NS IRRIG 1000ML POUR BTL (IV SOLUTION) ×2 IMPLANT
OIL CARTRIDGE MAESTRO DRILL (MISCELLANEOUS) ×2
PACK LAMINECTOMY NEURO (CUSTOM PROCEDURE TRAY) ×2 IMPLANT
PACK LAMINECTOMY ORTHO (CUSTOM PROCEDURE TRAY) IMPLANT
PACK UNIVERSAL I (CUSTOM PROCEDURE TRAY) ×2 IMPLANT
PAD ARMBOARD 7.5X6 YLW CONV (MISCELLANEOUS) ×4 IMPLANT
PATTIES SURGICAL .5 X.5 (GAUZE/BANDAGES/DRESSINGS) IMPLANT
PATTIES SURGICAL .5 X1 (DISPOSABLE) ×2 IMPLANT
SOL ANTI FOG 6CC (MISCELLANEOUS) ×1 IMPLANT
SOLUTION ANTI FOG 6CC (MISCELLANEOUS) ×1
SPONGE INTESTINAL PEANUT (DISPOSABLE) ×2 IMPLANT
SPONGE SURGIFOAM ABS GEL 100 (HEMOSTASIS) ×2 IMPLANT
SPONGE SURGIFOAM ABS GEL SZ50 (HEMOSTASIS) IMPLANT
STRIP CLOSURE SKIN 1/2X4 (GAUZE/BANDAGES/DRESSINGS) ×2 IMPLANT
SURGIFLO W/THROMBIN 8M KIT (HEMOSTASIS) ×2 IMPLANT
SUT MNCRL AB 4-0 PS2 18 (SUTURE) ×2 IMPLANT
SUT VIC AB 0 CT1 18XCR BRD 8 (SUTURE) IMPLANT
SUT VIC AB 0 CT1 8-18 (SUTURE)
SUT VIC AB 1 CT1 18XCR BRD 8 (SUTURE) ×1 IMPLANT
SUT VIC AB 1 CT1 8-18 (SUTURE) ×2
SUT VIC AB 2-0 CT2 18 VCP726D (SUTURE) ×2 IMPLANT
SYR 20ML LL LF (SYRINGE) ×2 IMPLANT
SYR BULB IRRIG 60ML STRL (SYRINGE) IMPLANT
SYR CONTROL 10ML LL (SYRINGE) ×2 IMPLANT
SYR TB 1ML 25GX5/8 (SYRINGE) IMPLANT
SYR TB 1ML LUER SLIP (SYRINGE) ×2 IMPLANT
TAPE CLOTH SURG 6X10 WHT LF (GAUZE/BANDAGES/DRESSINGS) ×2 IMPLANT
TOWEL GREEN STERILE (TOWEL DISPOSABLE) ×2 IMPLANT
TOWEL GREEN STERILE FF (TOWEL DISPOSABLE) ×2 IMPLANT
WATER STERILE IRR 1000ML POUR (IV SOLUTION) ×2 IMPLANT
YANKAUER SUCT BULB TIP NO VENT (SUCTIONS) ×2 IMPLANT

## 2021-05-03 NOTE — Op Note (Signed)
PATIENT NAME: Austin Daniel   MEDICAL RECORD NO.:   [id number]    DATE OF BIRTH: [birth year]   DATE OF PROCEDURE: 05/03/2021                               OPERATIVE REPORT     PREOPERATIVE DIAGNOSES: 1. Right-sided lumbar radiculopathy. 2. Right-sided L5-S1 disk herniation, compressing the right L5 and S1 nerves   POSTOPERATIVE DIAGNOSES: 1. Right-sided lumbar radiculopathy. 2. Right-sided L5-S1 disk herniation, compressing the right L5 and S1 nerves   PROCEDURES:  Right-sided L5-S1 laminotomy with partial facetectomy and removal of large herniated right-sided L5-S1 disk fragment.   SURGEON:  Estill Bamberg, MD.   ASSISTANTJason Coop, PA-C.   ANESTHESIA:  General endotracheal anesthesia.   COMPLICATIONS:  None.   DISPOSITION:  Stable.   ESTIMATED BLOOD LOSS:  Minimal.   INDICATIONS FOR SURGERY:  Briefly, Mr. Geraci is a pleasant 55 year old male, who did present to me with severe pain in the right leg.  The patient's MRI did reveal the findings outlined above, clearly notable for a herniated disk fragment int he foraminal and lateral recess region. The pain was rather severe.  We did discuss treatment options and we did ultimately elect to proceed with the procedure reflected above.  The patient was fully made aware of the risks of surgery, including the risk of recurrent herniation and the need for subsequent surgery, including the possibility of a subsequent diskectomy and/or fusion.   OPERATIVE DETAILS:  On 05/03/2021, the patient was brought to Surgery and general endotracheal anesthesia was administered.  The patient was placed prone on a well-padded flat Jackson bed with a spinal frame.  Antibiotics were given.  The back was prepped and draped and a time-out procedure was performed.  At this point, a midline incision was made directly over the L5-S1 intervertebral space.  A curvilinear incision was made just to the right of the midline into the fascia.   A self-retaining McCulloch retractor was placed.  The lamina of L5 and S1 was identified and subperiosteally exposed.  I then performed a right-sided L5-S1 partial facetectomy, and removed the lateral aspect of the L5-S1 ligamentum flavum.    Of note, I did extend the partial facetectomy more laterally than typical, given the fact that the patient's herniation was foraminal in nature, extending into the right lateral recess.  At this point, the right S1 nerve was identified and medially retracted. At the location of the right lateral recess and extending into the foramen, and obvious protrusion in the annulus was noted.  I did use a 15 blade knife to perform an annulotomy.  With downward pressure centered over the lateral aspect of the annulotomy, multiple disc fragments did herniate through the annulotomy defect, after which point they were removed.  Free disc fragments were identified in the extraforaminal region as well, and these were removed using a downward angled micropituitary. At the termination of the decompression, I was able to confirm complete decompression of the exiting right L5 nerve, and the traversing right S1 nerve.  I was very pleased with the decompression. At this point, the wound was copiously irrigated with normal saline.  All epidural bleeding was controlled using bipolar electrocautery in addition to Surgiflo. All bleeding was controlled at the termination of the procedure.  At this point, 40 mg of Depo-Medrol was introduced about the epidural space in the region of the right L5 and  S1 nerve.  The wound was then closed in layers using #1 Vicryl followed by 0 Vicryl, followed by 4-0 Monocryl. Benzoin and Steri-Strips were applied followed by a sterile dressing. All instrument counts were correct at the termination of the procedure.   Of note, Jason Coop was my assistant throughout surgery, and did aid in retraction, suctioning, and closure from start to finish.        Estill Bamberg, MD

## 2021-05-03 NOTE — H&P (Signed)
PREOPERATIVE H&P  Chief Complaint: Right leg pain  HPI: Austin Daniel is a 55 y.o. male who presents with ongoing pain in the right leg  MRI reveals a foraminal/lateral recess disc herniation on the right at L5/S1  Patient has failed multiple forms of conservative care and continues to have pain (see office notes for additional details regarding the patient's full course of treatment)  Past Medical History:  Diagnosis Date  . Complete rupture of rotator cuff 04/2013   left  . Degenerative arthritis of shoulder region 04/2013   left  . Dental crowns present   . Diabetes mellitus    type 2  . High cholesterol   . Shoulder impingement 04/2013   left   Past Surgical History:  Procedure Laterality Date  . EYE SURGERY    . FOREIGN BODY REMOVAL  11/12/2011   Procedure: FOREIGN BODY REMOVAL ADULT;  Surgeon: Wyn Forster., MD;  Location: [facility];  Service: Orthopedics;  Laterality: Right;  right long finger  . LASIK Bilateral   . SHOULDER ARTHROSCOPY Left   . SHOULDER ARTHROSCOPY Right   . SHOULDER ARTHROSCOPY W/ ROTATOR CUFF REPAIR Right 04/16/2001  . SHOULDER ARTHROSCOPY WITH ROTATOR CUFF REPAIR AND SUBACROMIAL DECOMPRESSION Left 04/29/2013   Procedure: LEFT SHOULDER ARTHROSCOPY WITH SUBACROMIAL DECOMPRESSION, PARTIAL ACROMIOPLASTY, WITH CORACOACROMIAL RELEASE, DISTAL CLAVICULECTOMY, WITH ROTATOR CUFF REPAIR;  Surgeon: Loreta Ave, MD;  Location: [facility];  Service: Orthopedics;  Laterality: Left;   Social History   Socioeconomic History  . Marital status: Married    Spouse name: Not on file  . Number of children: Not on file  . Years of education: Not on file  . Highest education level: Not on file  Occupational History  . Not on file  Tobacco Use  . Smoking status: Never Smoker  . Smokeless tobacco: Never Used  Vaping Use  . Vaping Use: Never used  Substance and Sexual Activity  . Alcohol use: Yes    Comment:  occasionally  . Drug use: No  . Sexual activity: Not on file  Other Topics Concern  . Not on file  Social History Narrative  . Not on file   Social Determinants of Health   Financial Resource Strain: Not on file  Food Insecurity: Not on file  Transportation Needs: Not on file  Physical Activity: Not on file  Stress: Not on file  Social Connections: Not on file   No family history on file. No Known Allergies Prior to Admission medications   Medication Sig Start Date End Date Taking? Authorizing Provider  BLACK CURRANT SEED OIL PO Take 1 capsule by mouth daily.   Yes [provider]  Coenzyme Q10 100 MG capsule Take 100 mg by mouth daily.   Yes [provider]  JANUMET XR 50-1000 MG TB24 Take 1 tablet by mouth every other day. 03/11/21  Yes [provider]  Multiple Vitamin (MULTIVITAMIN) tablet Take 1 tablet by mouth daily.   Yes [provider]  PINE BARK, PYCNOGENOL, PO Take 1 capsule by mouth daily.   Yes [provider]  sildenafil (VIAGRA) 100 MG tablet Take 100 mg by mouth daily as needed for erectile dysfunction. 04/08/21  Yes [provider]  tadalafil (CIALIS) 20 MG tablet Take 20 mg by mouth daily as needed for erectile dysfunction.   Yes [provider]  vitamin E 180 MG (400 UNITS) capsule Take 400 Units by mouth daily.  Yes [provider]  oxyCODONE-acetaminophen (PERCOCET) 5-325 MG per tablet Take 1-2 tablets by mouth every 4 (four) hours as needed. Patient not taking: No sig reported 04/29/13   Naida Sleight, PA-C     All other systems have been reviewed and were otherwise negative with the exception of those mentioned in the HPI and as above.  Physical Exam: There were no vitals filed for this visit.  There is no height or weight on file to calculate BMI.  General: Alert, no acute distress Cardiovascular: No pedal edema Respiratory: No cyanosis, no use of accessory musculature Skin: No  lesions in the area of chief complaint Neurologic: Sensation intact distally Psychiatric: Patient is competent for consent with normal mood and affect Lymphatic: No axillary or cervical lymphadenopathy  MUSCULOSKELETAL: + SLR on the right  Assessment/Plan: RIGHT-SIDED LUMBAR RADICULOPATHY Plan for Procedure(s): RIGHT-SIDED LUMBAR 5 - SACRUM 1 MICRODISECTOMY   Jackelyn Hoehn, MD 05/03/2021 6:43 AM

## 2021-05-03 NOTE — Anesthesia Procedure Notes (Signed)
Procedure Name: Intubation Date/Time: 05/03/2021 10:16 AM Performed by: Lanell Matar, CRNA Pre-anesthesia Checklist: Patient identified, Emergency Drugs available, Suction available and Patient being monitored Patient Re-evaluated:Patient Re-evaluated prior to induction Oxygen Delivery Method: Circle System Utilized Preoxygenation: Pre-oxygenation with 100% oxygen Induction Type: IV induction Ventilation: Mask ventilation without difficulty and Oral airway inserted - appropriate to patient size Laryngoscope Size: Hyacinth Meeker and 2 Grade View: Grade II Tube type: Oral Tube size: 7.5 mm Number of attempts: 1 Airway Equipment and Method: Stylet and Oral airway Placement Confirmation: ETT inserted through vocal cords under direct vision,  positive ETCO2 and breath sounds checked- equal and bilateral Secured at: 22 cm Tube secured with: Tape Dental Injury: Teeth and Oropharynx as per pre-operative assessment

## 2021-05-03 NOTE — Anesthesia Preprocedure Evaluation (Signed)
Anesthesia Evaluation  Patient identified by MRN, date of birth, ID band Patient awake    Reviewed: Allergy & Precautions, NPO status , Patient's Chart, lab work & pertinent test results  History of Anesthesia Complications Negative for: history of anesthetic complications  Airway Mallampati: II  TM Distance: >3 FB Neck ROM: Full    Dental  (+) Dental Advisory Given, Teeth Intact   Pulmonary neg shortness of breath, neg COPD, neg recent URI,  Covid-19 Nucleic Acid Test Results Lab Results      Component                Value               Date                      SARSCOV2NAA              NEGATIVE            05/02/2021              breath sounds clear to auscultation       Cardiovascular negative cardio ROS   Rhythm:Regular     Neuro/Psych negative neurological ROS  negative psych ROS   GI/Hepatic negative GI ROS, Neg liver ROS,   Endo/Other  diabetes, Type 2  Renal/GU negative Renal ROS     Musculoskeletal  (+) Arthritis ,   Abdominal   Peds  Hematology negative hematology ROS (+)   Anesthesia Other Findings   Reproductive/Obstetrics                             Anesthesia Physical Anesthesia Plan  ASA: II  Anesthesia Plan: General   Post-op Pain Management:    Induction: Intravenous  PONV Risk Score and Plan: 2 and Ondansetron and Dexamethasone  Airway Management Planned: Oral ETT  Additional Equipment: None  Intra-op Plan:   Post-operative Plan: Extubation in OR  Informed Consent: I have reviewed the patients History and Physical, chart, labs and discussed the procedure including the risks, benefits and alternatives for the proposed anesthesia with the patient or authorized representative who has indicated his/her understanding and acceptance.     Dental advisory given  Plan Discussed with: CRNA and Surgeon  Anesthesia Plan Comments:         Anesthesia  Quick Evaluation

## 2021-05-03 NOTE — Transfer of Care (Signed)
Immediate Anesthesia Transfer of Care Note  Patient: Austin Daniel  Procedure(s) Performed: RIGHT-SIDED LUMBAR FIVE- SACRUM ONE MICRODISECTOMY (Right Back)  Patient Location: PACU  Anesthesia Type:General  Level of Consciousness: awake, drowsy and patient cooperative  Airway & Oxygen Therapy: Patient Spontanous Breathing and Patient connected to nasal cannula oxygen  Post-op Assessment: Report given to RN, Post -op Vital signs reviewed and stable and Patient moving all extremities X 4  Post vital signs: Reviewed and stable  Last Vitals:  Vitals Value Taken Time  BP 143/91 05/03/21 1235  Temp    Pulse 62 05/03/21 1236  Resp 16 05/03/21 1236  SpO2 100 % 05/03/21 1236  Vitals shown include unvalidated device data.  Last Pain:  Vitals:   05/03/21 0805  TempSrc:   PainSc: 0-No pain         Complications: No complications documented.

## 2021-05-04 ENCOUNTER — Encounter (HOSPITAL_COMMUNITY): Payer: Self-pay | Admitting: Orthopedic Surgery

## 2021-05-07 NOTE — Anesthesia Postprocedure Evaluation (Signed)
Anesthesia Post Note  Patient: Austin Daniel  Procedure(s) Performed: RIGHT-SIDED LUMBAR FIVE- SACRUM ONE MICRODISECTOMY (Right Back)     Patient location during evaluation: PACU Anesthesia Type: General Level of consciousness: awake and alert Pain management: pain level controlled Vital Signs Assessment: post-procedure vital signs reviewed and stable Respiratory status: spontaneous breathing, nonlabored ventilation, respiratory function stable and patient connected to nasal cannula oxygen Cardiovascular status: blood pressure returned to baseline and stable Postop Assessment: no apparent nausea or vomiting Anesthetic complications: no   No complications documented.  Last Vitals:  Vitals:   05/03/21 1253 05/03/21 1309  BP: (!) 141/100 (!) 139/96  Pulse: 61 (!) 59  Resp: 14 17  Temp:  36.6 C  SpO2: 100% 100%    Last Pain:  Vitals:   05/03/21 1309  TempSrc:   PainSc: 3                   

## 2021-06-15 DIAGNOSIS — M6281 Muscle weakness (generalized): Secondary | ICD-10-CM | POA: Diagnosis not present

## 2021-06-15 DIAGNOSIS — Z9889 Other specified postprocedural states: Secondary | ICD-10-CM | POA: Diagnosis not present

## 2021-06-18 DIAGNOSIS — M6281 Muscle weakness (generalized): Secondary | ICD-10-CM | POA: Diagnosis not present

## 2021-06-18 DIAGNOSIS — Z9889 Other specified postprocedural states: Secondary | ICD-10-CM | POA: Diagnosis not present

## 2021-06-20 DIAGNOSIS — Z9889 Other specified postprocedural states: Secondary | ICD-10-CM | POA: Diagnosis not present

## 2021-06-20 DIAGNOSIS — M6281 Muscle weakness (generalized): Secondary | ICD-10-CM | POA: Diagnosis not present

## 2021-06-28 DIAGNOSIS — M6281 Muscle weakness (generalized): Secondary | ICD-10-CM | POA: Diagnosis not present

## 2021-06-28 DIAGNOSIS — Z9889 Other specified postprocedural states: Secondary | ICD-10-CM | POA: Diagnosis not present

## 2021-07-03 DIAGNOSIS — M6281 Muscle weakness (generalized): Secondary | ICD-10-CM | POA: Diagnosis not present

## 2021-07-03 DIAGNOSIS — Z9889 Other specified postprocedural states: Secondary | ICD-10-CM | POA: Diagnosis not present

## 2021-07-05 DIAGNOSIS — Z9889 Other specified postprocedural states: Secondary | ICD-10-CM | POA: Diagnosis not present

## 2021-07-05 DIAGNOSIS — M6281 Muscle weakness (generalized): Secondary | ICD-10-CM | POA: Diagnosis not present

## 2021-07-09 DIAGNOSIS — Z9889 Other specified postprocedural states: Secondary | ICD-10-CM | POA: Diagnosis not present

## 2021-07-09 DIAGNOSIS — M6281 Muscle weakness (generalized): Secondary | ICD-10-CM | POA: Diagnosis not present

## 2021-07-11 DIAGNOSIS — M6281 Muscle weakness (generalized): Secondary | ICD-10-CM | POA: Diagnosis not present

## 2021-07-11 DIAGNOSIS — Z9889 Other specified postprocedural states: Secondary | ICD-10-CM | POA: Diagnosis not present

## 2021-07-16 DIAGNOSIS — Z9889 Other specified postprocedural states: Secondary | ICD-10-CM | POA: Diagnosis not present

## 2021-07-16 DIAGNOSIS — M6281 Muscle weakness (generalized): Secondary | ICD-10-CM | POA: Diagnosis not present

## 2021-07-18 DIAGNOSIS — Z9889 Other specified postprocedural states: Secondary | ICD-10-CM | POA: Diagnosis not present

## 2021-07-18 DIAGNOSIS — M6281 Muscle weakness (generalized): Secondary | ICD-10-CM | POA: Diagnosis not present

## 2021-07-24 DIAGNOSIS — M6281 Muscle weakness (generalized): Secondary | ICD-10-CM | POA: Diagnosis not present

## 2021-07-24 DIAGNOSIS — Z9889 Other specified postprocedural states: Secondary | ICD-10-CM | POA: Diagnosis not present

## 2021-07-27 DIAGNOSIS — M6281 Muscle weakness (generalized): Secondary | ICD-10-CM | POA: Diagnosis not present

## 2021-07-27 DIAGNOSIS — Z9889 Other specified postprocedural states: Secondary | ICD-10-CM | POA: Diagnosis not present

## 2021-07-30 DIAGNOSIS — M6281 Muscle weakness (generalized): Secondary | ICD-10-CM | POA: Diagnosis not present

## 2021-07-30 DIAGNOSIS — Z9889 Other specified postprocedural states: Secondary | ICD-10-CM | POA: Diagnosis not present

## 2021-08-01 DIAGNOSIS — M6281 Muscle weakness (generalized): Secondary | ICD-10-CM | POA: Diagnosis not present

## 2021-08-01 DIAGNOSIS — Z9889 Other specified postprocedural states: Secondary | ICD-10-CM | POA: Diagnosis not present

## 2021-08-06 DIAGNOSIS — Z9889 Other specified postprocedural states: Secondary | ICD-10-CM | POA: Diagnosis not present

## 2021-08-06 DIAGNOSIS — M6281 Muscle weakness (generalized): Secondary | ICD-10-CM | POA: Diagnosis not present

## 2021-08-07 DIAGNOSIS — I10 Essential (primary) hypertension: Secondary | ICD-10-CM | POA: Diagnosis not present

## 2021-08-07 DIAGNOSIS — E7800 Pure hypercholesterolemia, unspecified: Secondary | ICD-10-CM | POA: Diagnosis not present

## 2021-08-07 DIAGNOSIS — M130 Polyarthritis, unspecified: Secondary | ICD-10-CM | POA: Diagnosis not present

## 2021-08-07 DIAGNOSIS — E1169 Type 2 diabetes mellitus with other specified complication: Secondary | ICD-10-CM | POA: Diagnosis not present

## 2021-08-08 DIAGNOSIS — Z9889 Other specified postprocedural states: Secondary | ICD-10-CM | POA: Diagnosis not present

## 2021-08-08 DIAGNOSIS — M6281 Muscle weakness (generalized): Secondary | ICD-10-CM | POA: Diagnosis not present

## 2021-08-13 DIAGNOSIS — Z9889 Other specified postprocedural states: Secondary | ICD-10-CM | POA: Diagnosis not present

## 2021-08-13 DIAGNOSIS — M6281 Muscle weakness (generalized): Secondary | ICD-10-CM | POA: Diagnosis not present

## 2021-08-13 DIAGNOSIS — M545 Low back pain, unspecified: Secondary | ICD-10-CM | POA: Diagnosis not present

## 2021-08-15 DIAGNOSIS — M6281 Muscle weakness (generalized): Secondary | ICD-10-CM | POA: Diagnosis not present

## 2021-08-15 DIAGNOSIS — Z9889 Other specified postprocedural states: Secondary | ICD-10-CM | POA: Diagnosis not present

## 2021-08-17 DIAGNOSIS — E119 Type 2 diabetes mellitus without complications: Secondary | ICD-10-CM | POA: Diagnosis not present

## 2021-08-17 DIAGNOSIS — R972 Elevated prostate specific antigen [PSA]: Secondary | ICD-10-CM | POA: Diagnosis not present

## 2021-08-17 DIAGNOSIS — E291 Testicular hypofunction: Secondary | ICD-10-CM | POA: Diagnosis not present

## 2021-08-17 DIAGNOSIS — M2041 Other hammer toe(s) (acquired), right foot: Secondary | ICD-10-CM | POA: Diagnosis not present

## 2021-08-17 DIAGNOSIS — M2141 Flat foot [pes planus] (acquired), right foot: Secondary | ICD-10-CM | POA: Diagnosis not present

## 2021-08-17 DIAGNOSIS — M2010 Hallux valgus (acquired), unspecified foot: Secondary | ICD-10-CM | POA: Diagnosis not present

## 2021-11-01 DIAGNOSIS — E119 Type 2 diabetes mellitus without complications: Secondary | ICD-10-CM | POA: Diagnosis not present

## 2021-11-30 DIAGNOSIS — E1169 Type 2 diabetes mellitus with other specified complication: Secondary | ICD-10-CM | POA: Diagnosis not present

## 2021-11-30 DIAGNOSIS — I10 Essential (primary) hypertension: Secondary | ICD-10-CM | POA: Diagnosis not present

## 2021-12-07 DIAGNOSIS — M795 Residual foreign body in soft tissue: Secondary | ICD-10-CM | POA: Diagnosis not present

## 2021-12-07 DIAGNOSIS — S64492A Injury of digital nerve of right middle finger, initial encounter: Secondary | ICD-10-CM | POA: Diagnosis not present

## 2022-07-07 IMAGING — CR DG LUMBAR SPINE 2-3V
2 series · 2 of 2 positions shown · non-contrast
Comparison: 05/24/2004

CLINICAL DATA: Right L5-S1 microdiscectomy.

EXAM:
LUMBAR SPINE - 2-3 VIEW

[lateral (1 of 2)]
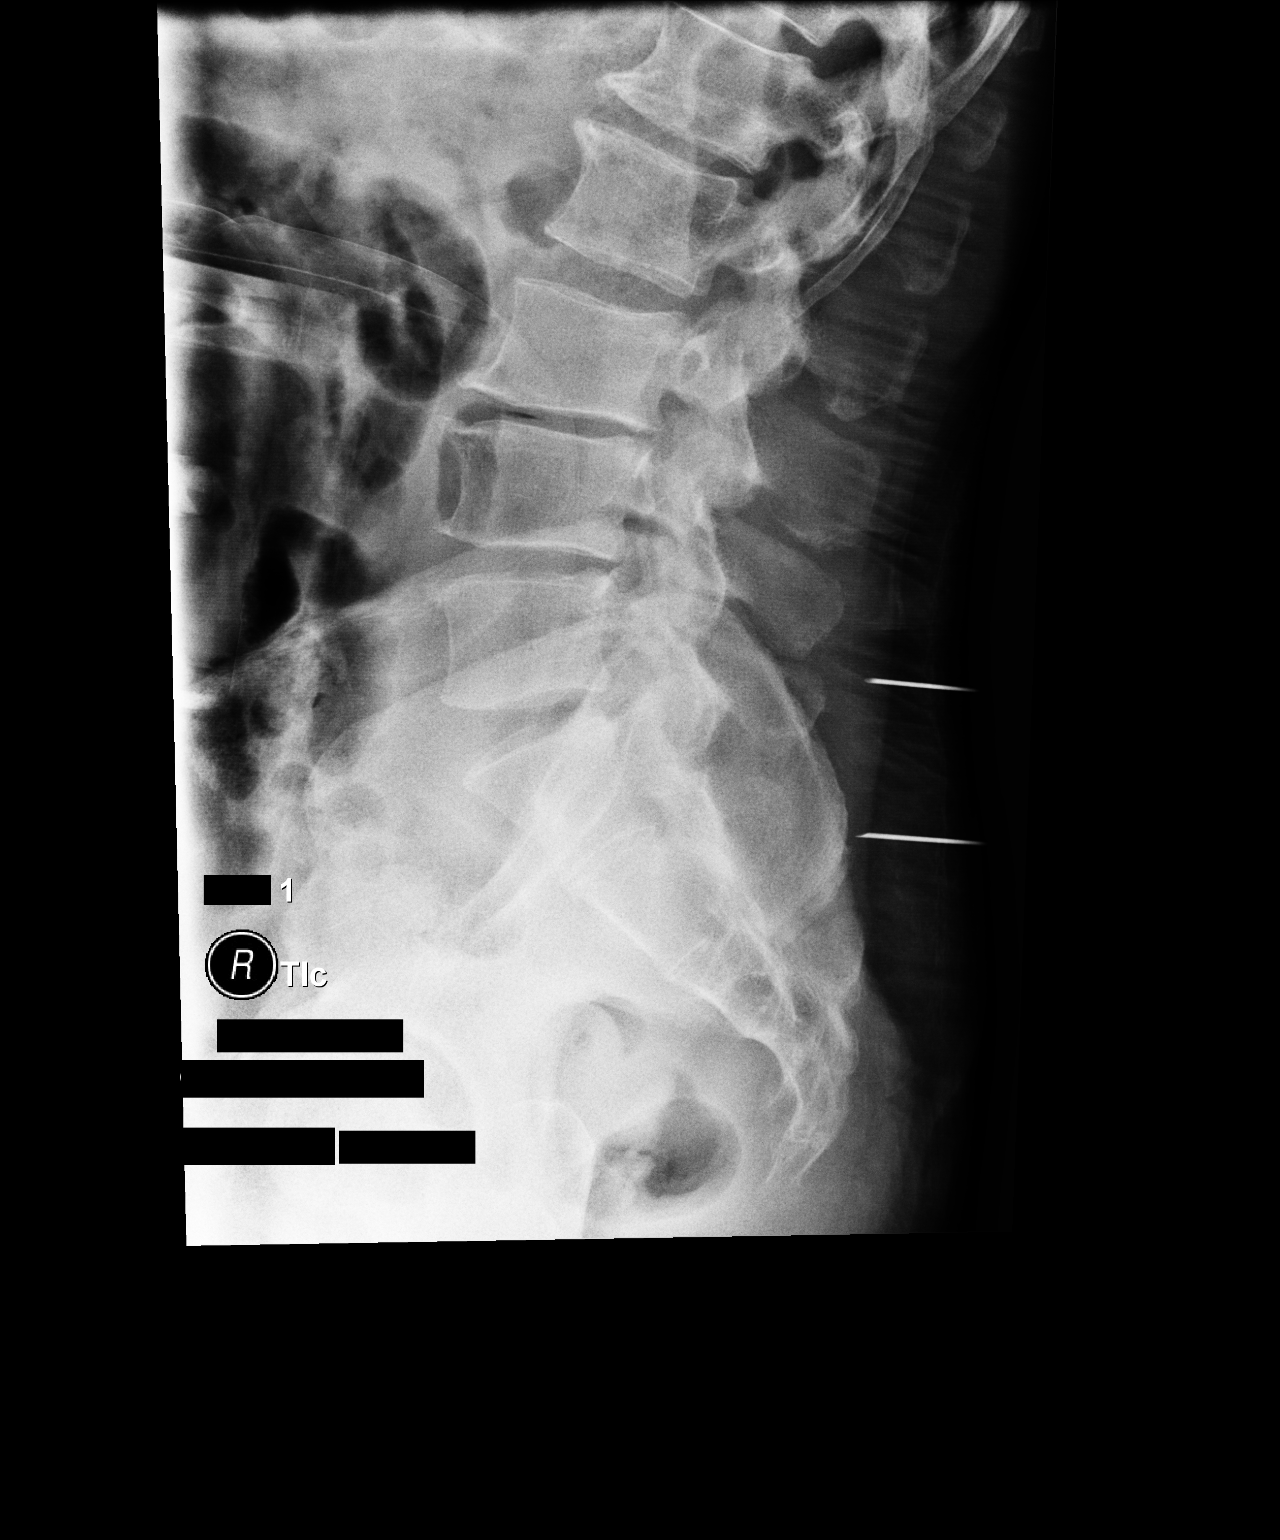

[lateral (2 of 2)]
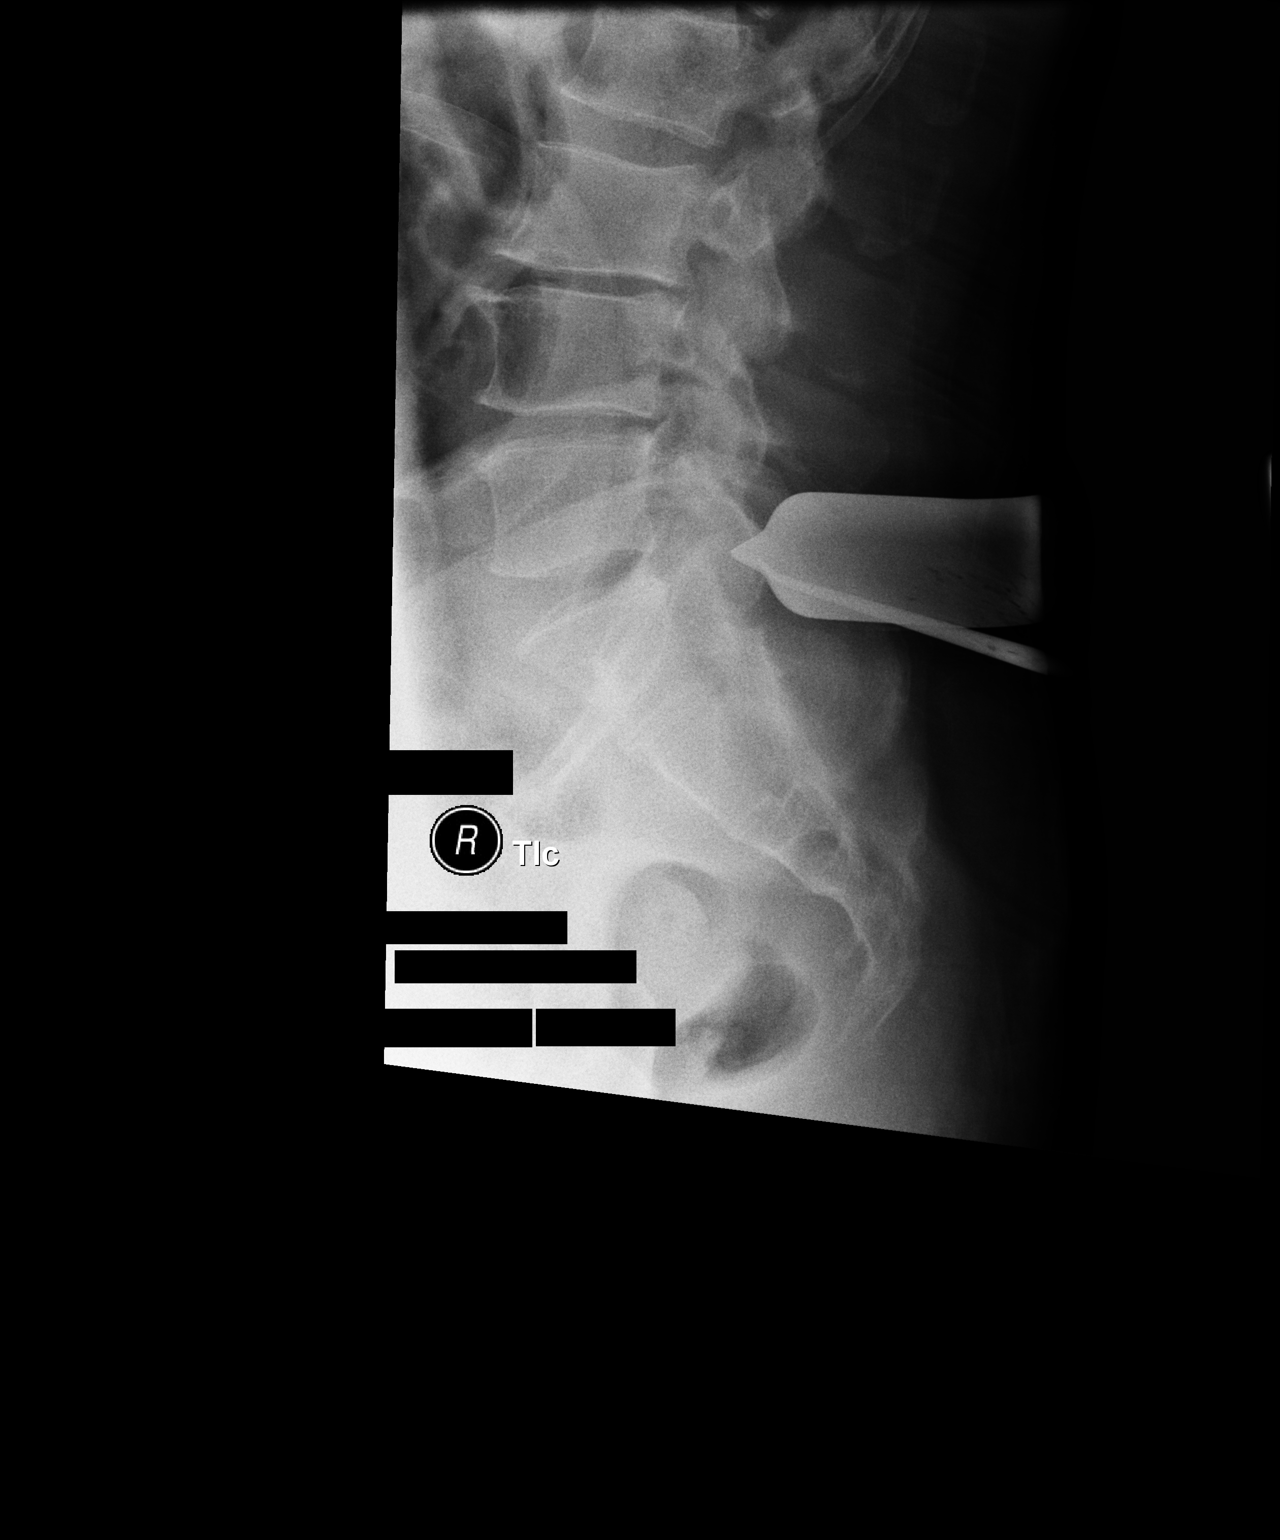

[2 of 2 positions shown; findings below may reference images not displayed]

FINDINGS: The 1st image demonstrates localizers posterior to the L5 and upper
S2 levels. The 2nd image demonstrates a localizer with its tip
overlying the facets at the L5-S1 level.
IMPRESSION: Localizers, as described above.

## 2022-08-05 ENCOUNTER — Other Ambulatory Visit: Payer: Self-pay | Admitting: Family Medicine

## 2022-08-05 ENCOUNTER — Ambulatory Visit
Admission: RE | Admit: 2022-08-05 | Discharge: 2022-08-05 | Disposition: A | Discharge status: Home / Self Care | Payer: BC Managed Care – PPO | Source: Ambulatory Visit | Attending: Family Medicine | Admitting: Family Medicine

## 2022-08-05 DIAGNOSIS — M130 Polyarthritis, unspecified: Secondary | ICD-10-CM

## 2023-04-23 HISTORY — PX: ELBOW ARTHROSCOPY WITH TENDON RECONSTRUCTION: SHX5616

## 2023-07-22 ENCOUNTER — Other Ambulatory Visit (HOSPITAL_COMMUNITY): Payer: Self-pay | Admitting: Urology

## 2023-07-22 ENCOUNTER — Other Ambulatory Visit: Payer: Self-pay | Admitting: Urology

## 2023-07-22 DIAGNOSIS — R972 Elevated prostate specific antigen [PSA]: Secondary | ICD-10-CM

## 2023-08-02 NOTE — Progress Notes (Addendum)
COVID Vaccine received:  []  No [x]  Yes Date of any COVID positive Test in last 90 days:  None  PCP -   Renaye Rakers, MD   Lucile Crater, NP-BC at [facility] Cardiologist - None  Chest x-ray -  EKG - (05-02-21)  repeat at PST Stress Test -  ECHO -  Cardiac Cath -   PCR screen: []  Ordered & Completed           []   No Order but Needs PROFEND           [x]   N/A for this surgery  Surgery Plan:  [x]  Ambulatory                            []  Outpatient in bed                            []  Admit  Anesthesia:    []  General  []  Spinal                           []   Choice [x]   MAC  Bowel Prep - []  No  [x]   Yes 1 Fleet Enema _the morning of surgery  Pacemaker / ICD device [x]  No []  Yes   Spinal Cord Stimulator:[x]  No []  Yes       History of Sleep Apnea? [x]  No []  Yes   CPAP used?- [x]  No []  Yes    Does the patient monitor blood sugar?          []  No [x]  Yes  []  N/A  Patient has: []  NO Hx DM   [x]  Pre-DM                 []  DM1  []   DM2 Diabetic medications/ instructions: Janumet (sitagliptin/ metformin) 50-1000mg  ;    Hold DOS  Blood Thinner / Instructions:  none Aspirin Instructions:  none  ERAS Protocol Ordered: [x]  No  []  Yes Patient is to be NPO after: Midnight prior  Activity level: Patient is able to climb a flight of stairs without difficulty; [x]  No CP  [x]  No SOB.  Patient can perform ADLs without assistance.   Anesthesia review:  Pre-DM, No other pertinent medical history.  Patient denies shortness of breath, fever, cough and chest pain at PAT appointment.  Patient verbalized understanding and agreement to the Pre-Surgical Instructions that were given to them at this PAT appointment. Patient was also educated of the need to review these PAT instructions again prior to his/her surgery.I reviewed the appropriate phone numbers to call if they have any and questions or concerns.

## 2023-08-02 NOTE — Patient Instructions (Signed)
SURGICAL WAITING ROOM VISITATION Patients having surgery or a procedure may have no more than 2 support people in the waiting area - these visitors may rotate in the visitor waiting room.   Due to an increase in RSV and influenza rates and associated hospitalizations, children ages 101 and under may not visit patients in [facility] hospitals. If the patient needs to stay at the hospital during part of their recovery, the visitor guidelines for inpatient rooms apply.  PRE-OP VISITATION  Pre-op nurse will coordinate an appropriate time for 1 support person to accompany the patient in pre-op.  This support person may not rotate.  This visitor will be contacted when the time is appropriate for the visitor to come back in the pre-op area.  Please refer to the [facility] website for the visitor guidelines for Inpatients (after your surgery is over and you are in a regular room).  You are not required to quarantine at this time prior to your surgery. However, you must do this: Hand Hygiene often Do NOT share personal items Notify your provider if you are in close contact with someone who has COVID or you develop fever 100.4 or greater, new onset of sneezing, cough, sore throat, shortness of breath or body aches.  If you test positive for Covid or have been in contact with anyone that has tested positive in the last 10 days please notify you surgeon.    Your procedure is scheduled on: Tuesday  August 12, 2023   Report to [facility] Main Entrance: [place] entrance where the [employer] is available.   Report to admitting at:   12:30 PM  Call this number if you have any questions or problems the morning of surgery [phone]  DO NOT EAT OR DRINK ANYTHING AFTER MIDNIGHT THE NIGHT PRIOR TO YOUR SURGERY / PROCEDURE.   FOLLOW BOWEL PREP AND ANY ADDITIONAL PRE OP INSTRUCTIONS YOU RECEIVED FROM YOUR SURGEON'S OFFICE!!!  Use 1 Fleet Enema (according to directions on box), the morning of  your surgery.    Oral Hygiene is also important to reduce your risk of infection.        Remember - BRUSH YOUR TEETH THE MORNING OF SURGERY WITH YOUR REGULAR TOOTHPASTE  Do NOT smoke after Midnight the night before surgery.  JANUMET:  Day before Surgery:  You may take Janumet.              DAY OF SURGERY: Do NOT take Janumet  STOP TAKING all Vitamins, Herbs and supplements 1 week before your surgery.   Take ONLY these medicines the morning of surgery with A SIP OF WATER: none                    You may not have any metal on your body including  jewelry, and body piercing  Do not wear  lotions, powders, cologne, or deodorant  Men may shave face and neck.  Contacts, Hearing Aids, dentures or bridgework may not be worn into surgery. DENTURES WILL BE REMOVED PRIOR TO SURGERY PLEASE DO NOT APPLY "Poly grip" OR ADHESIVES!!!  Patients discharged on the day of surgery will not be allowed to drive home.  Someone NEEDS to stay with you for the first 24 hours after anesthesia.  Do not bring your home medications to the hospital. The Pharmacy will dispense medications listed on your medication list to you during your admission in the Hospital.  Please read over the following fact sheets you were given:  IF YOU HAVE QUESTIONS ABOUT YOUR PRE-OP INSTRUCTIONS, PLEASE CALL [phone].   [facility] - Preparing for Surgery Before surgery, you can play an important role.  Because skin is not sterile, your skin needs to be as free of germs as possible.  You can reduce the number of germs on your skin by washing with CHG (chlorahexidine gluconate) soap before surgery.  CHG is an antiseptic cleaner which kills germs and bonds with the skin to continue killing germs even after washing. Please DO NOT use if you have an allergy to CHG or antibacterial soaps.  If your skin becomes reddened/irritated stop using the CHG and inform your nurse when you arrive at Short Stay. Do not shave (including legs and  underarms) for at least 48 hours prior to the first CHG shower.  You may shave your face/neck.  Please follow these instructions carefully:  1.  Shower with CHG Soap the night before surgery and the  morning of surgery.  2.  If you choose to wash your hair, wash your hair first as usual with your normal  shampoo.  3.  After you shampoo, rinse your hair and body thoroughly to remove the shampoo.                             4.  Use CHG as you would any other liquid soap.  You can apply chg directly to the skin and wash.  Gently with a scrungie or clean washcloth.  5.  Apply the CHG Soap to your body ONLY FROM THE NECK DOWN.   Do not use on face/ open                           Wound or open sores. Avoid contact with eyes, ears mouth and genitals (private parts).                       Wash face,  Genitals (private parts) with your normal soap.             6.  Wash thoroughly, paying special attention to the area where your  surgery  will be performed.  7.  Thoroughly rinse your body with warm water from the neck down.  8.  DO NOT shower/wash with your normal soap after using and rinsing off the CHG Soap.            9.  Pat yourself dry with a clean towel.            10.  Wear clean pajamas.            11.  Place clean sheets on your bed the night of your first shower and do not  sleep with pets.  ON THE DAY OF SURGERY : Do not apply any lotions/deodorants the morning of surgery.  Please wear clean clothes to the hospital/surgery center.    FAILURE TO FOLLOW THESE INSTRUCTIONS MAY RESULT IN THE CANCELLATION OF YOUR SURGERY  PATIENT SIGNATURE_________________________________  NURSE SIGNATURE__________________________________  ________________________________________________________________________

## 2023-08-04 ENCOUNTER — Other Ambulatory Visit: Payer: Self-pay

## 2023-08-04 ENCOUNTER — Encounter (HOSPITAL_COMMUNITY): Payer: Self-pay

## 2023-08-04 ENCOUNTER — Encounter (HOSPITAL_COMMUNITY)
Admission: RE | Admit: 2023-08-04 | Discharge: 2023-08-04 | Disposition: A | Discharge status: Home / Self Care | Payer: BC Managed Care – PPO | Source: Ambulatory Visit | Attending: Urology | Admitting: Urology

## 2023-08-04 VITALS — BP 122/88 | HR 74 | Temp 99.2°F | Resp 18 | Ht 73.0 in | Wt 222.0 lb

## 2023-08-04 DIAGNOSIS — R9431 Abnormal electrocardiogram [ECG] [EKG]: Secondary | ICD-10-CM | POA: Diagnosis not present

## 2023-08-04 DIAGNOSIS — Z01818 Encounter for other preprocedural examination: Secondary | ICD-10-CM | POA: Diagnosis present

## 2023-08-04 DIAGNOSIS — R7303 Prediabetes: Secondary | ICD-10-CM | POA: Diagnosis not present

## 2023-08-04 HISTORY — DX: Prediabetes: R73.03

## 2023-08-04 LAB — BASIC METABOLIC PANEL
Anion gap: 9 (ref 5–15)
BUN: 15 mg/dL (ref 6–20)
CO2: 25 mmol/L (ref 22–32)
Calcium: 9 mg/dL (ref 8.9–10.3)
Chloride: 103 mmol/L (ref 98–111)
Creatinine, Ser: 1.19 mg/dL (ref 0.61–1.24)
GFR, Estimated: >60 mL/min (ref >60)
Glucose, Bld: 103 mg/dL — ABNORMAL HIGH (ref 70–99)
Potassium: 4 mmol/L (ref 3.5–5.1)
Sodium: 137 mmol/L (ref 135–145)

## 2023-08-04 LAB — CBC
HCT: 44.1 % (ref 39.0–52.0)
Hemoglobin: 15.3 g/dL (ref 13.0–17.0)
MCH: 30.5 pg (ref 26.0–34.0)
MCHC: 34.7 g/dL (ref 30.0–36.0)
MCV: 87.8 fL (ref 80.0–100.0)
Platelets: 201 10*3/uL (ref 150–400)
RBC: 5.02 MIL/uL (ref 4.22–5.81)
RDW: 14.2 % (ref 11.5–15.5)
WBC: 3.8 10*3/uL — ABNORMAL LOW (ref 4.0–10.5)
nRBC: 0 % (ref 0.0–0.2)

## 2023-08-11 NOTE — Progress Notes (Signed)
Spoke with Mr.Cavin made aware of his surgery time change for tomorrow. To arrive at 1100 for 1315 surgery.  All other pre surgery instructions remain the same nothing to eat or drink after midnight.  Verbalized understanding.

## 2023-08-12 ENCOUNTER — Ambulatory Visit (HOSPITAL_COMMUNITY): Payer: BC Managed Care – PPO

## 2023-08-12 ENCOUNTER — Encounter (HOSPITAL_COMMUNITY)
Admission: RE | Disposition: A | Discharge status: Home / Self Care | Payer: Self-pay | Source: Home / Self Care | Attending: Urology

## 2023-08-12 ENCOUNTER — Encounter (HOSPITAL_COMMUNITY): Payer: Self-pay | Admitting: Urology

## 2023-08-12 ENCOUNTER — Other Ambulatory Visit: Payer: Self-pay

## 2023-08-12 ENCOUNTER — Ambulatory Visit (HOSPITAL_COMMUNITY)
Admission: RE | Admit: 2023-08-12 | Discharge: 2023-08-12 | Disposition: A | Discharge status: Home / Self Care | Payer: BC Managed Care – PPO | Source: Ambulatory Visit | Attending: Urology | Admitting: Urology

## 2023-08-12 ENCOUNTER — Ambulatory Visit (HOSPITAL_COMMUNITY)
Admission: RE | Admit: 2023-08-12 | Discharge: 2023-08-12 | Disposition: A | Discharge status: Home / Self Care | Payer: BC Managed Care – PPO | Attending: Urology | Admitting: Urology

## 2023-08-12 DIAGNOSIS — C61 Malignant neoplasm of prostate: Secondary | ICD-10-CM | POA: Insufficient documentation

## 2023-08-12 DIAGNOSIS — N401 Enlarged prostate with lower urinary tract symptoms: Secondary | ICD-10-CM | POA: Insufficient documentation

## 2023-08-12 DIAGNOSIS — E119 Type 2 diabetes mellitus without complications: Secondary | ICD-10-CM | POA: Insufficient documentation

## 2023-08-12 DIAGNOSIS — R351 Nocturia: Secondary | ICD-10-CM | POA: Insufficient documentation

## 2023-08-12 DIAGNOSIS — R972 Elevated prostate specific antigen [PSA]: Secondary | ICD-10-CM

## 2023-08-12 HISTORY — PX: PROSTATE BIOPSY: SHX241

## 2023-08-12 LAB — GLUCOSE, CAPILLARY
Glucose-Capillary: 91 mg/dL (ref 70–99)
Glucose-Capillary: 87 mg/dL (ref 70–99)

## 2023-08-12 SURGERY — BIOPSY, PROSTATE, RECTAL APPROACH, WITH US GUIDANCE
Anesthesia: Monitor Anesthesia Care | Site: Rectum

## 2023-08-12 MED ORDER — FENTANYL CITRATE (PF) 100 MCG/2ML IJ SOLN
INTRAMUSCULAR | Status: AC
  Filled 2023-08-12: qty 2

## 2023-08-12 MED ORDER — OXYCODONE HCL 5 MG/5ML PO SOLN: 5.0000 mg | Freq: Once | ORAL | Status: DC | PRN

## 2023-08-12 MED ORDER — PROPOFOL 500 MG/50ML IV EMUL
INTRAVENOUS | Status: DC | PRN
  Administered 2023-08-12: 150 ug/kg/min via INTRAVENOUS

## 2023-08-12 MED ORDER — SODIUM CHLORIDE 0.9 % IV SOLN
2.0000 g | INTRAVENOUS | Status: AC
  Administered 2023-08-12: 2 g via INTRAVENOUS
  Filled 2023-08-12: qty 20

## 2023-08-12 MED ORDER — PROPOFOL 10 MG/ML IV BOLUS
INTRAVENOUS | Status: AC
  Filled 2023-08-12: qty 20

## 2023-08-12 MED ORDER — ORAL CARE MOUTH RINSE: 15.0000 mL | Freq: Once | OROMUCOSAL | Status: AC

## 2023-08-12 MED ORDER — MIDAZOLAM HCL 2 MG/2ML IJ SOLN
INTRAMUSCULAR | Status: AC
  Filled 2023-08-12: qty 2

## 2023-08-12 MED ORDER — ONDANSETRON HCL 4 MG/2ML IJ SOLN
INTRAMUSCULAR | Status: AC
  Filled 2023-08-12: qty 2

## 2023-08-12 MED ORDER — PROPOFOL 10 MG/ML IV BOLUS
INTRAVENOUS | Status: DC | PRN
  Administered 2023-08-12: 10 mg via INTRAVENOUS

## 2023-08-12 MED ORDER — SODIUM CHLORIDE 0.9% FLUSH: 3.0000 mL | Freq: Two times a day (BID) | INTRAVENOUS | Status: DC

## 2023-08-12 MED ORDER — FENTANYL CITRATE PF 50 MCG/ML IJ SOSY
25.0000 ug | PREFILLED_SYRINGE | INTRAMUSCULAR | Status: DC | PRN
  Administered 2023-08-12: 50 ug via INTRAVENOUS

## 2023-08-12 MED ORDER — LIDOCAINE HCL 2 % IJ SOLN
INTRAMUSCULAR | Status: AC
  Filled 2023-08-12: qty 20

## 2023-08-12 MED ORDER — EPHEDRINE 5 MG/ML INJ
INTRAVENOUS | Status: AC
  Filled 2023-08-12: qty 5

## 2023-08-12 MED ORDER — CHLORHEXIDINE GLUCONATE 0.12 % MT SOLN
15.0000 mL | Freq: Once | OROMUCOSAL | Status: AC
  Administered 2023-08-12: 15 mL via OROMUCOSAL

## 2023-08-12 MED ORDER — GENTAMICIN SULFATE 40 MG/ML IJ SOLN
5.0000 mg/kg | INTRAVENOUS | Status: AC
  Administered 2023-08-12: 440 mg via INTRAVENOUS
  Filled 2023-08-12: qty 11

## 2023-08-12 MED ORDER — DROPERIDOL 2.5 MG/ML IJ SOLN: 0.6250 mg | Freq: Once | INTRAMUSCULAR | Status: DC | PRN

## 2023-08-12 MED ORDER — FENTANYL CITRATE PF 50 MCG/ML IJ SOSY
PREFILLED_SYRINGE | INTRAMUSCULAR | Status: AC
  Filled 2023-08-12: qty 1

## 2023-08-12 MED ORDER — FENTANYL CITRATE (PF) 100 MCG/2ML IJ SOLN
INTRAMUSCULAR | Status: DC | PRN
  Administered 2023-08-12: 25 ug via INTRAVENOUS
  Administered 2023-08-12: 50 ug via INTRAVENOUS

## 2023-08-12 MED ORDER — DEXAMETHASONE SODIUM PHOSPHATE 10 MG/ML IJ SOLN
INTRAMUSCULAR | Status: AC
  Filled 2023-08-12: qty 1

## 2023-08-12 MED ORDER — DEXAMETHASONE SODIUM PHOSPHATE 10 MG/ML IJ SOLN
INTRAMUSCULAR | Status: DC | PRN
  Administered 2023-08-12: 4 mg via INTRAVENOUS

## 2023-08-12 MED ORDER — ROCURONIUM BROMIDE 10 MG/ML (PF) SYRINGE
PREFILLED_SYRINGE | INTRAVENOUS | Status: AC
  Filled 2023-08-12: qty 10

## 2023-08-12 MED ORDER — OXYCODONE HCL 5 MG PO TABS: 5.0000 mg | ORAL_TABLET | Freq: Once | ORAL | Status: DC | PRN

## 2023-08-12 MED ORDER — MIDAZOLAM HCL 2 MG/2ML IJ SOLN
INTRAMUSCULAR | Status: DC | PRN
  Administered 2023-08-12: 2 mg via INTRAVENOUS

## 2023-08-12 MED ORDER — ONDANSETRON HCL 4 MG/2ML IJ SOLN
INTRAMUSCULAR | Status: DC | PRN
Start: 2023-08-12 — End: 2023-08-12
  Administered 2023-08-12: 4 mg via INTRAVENOUS

## 2023-08-12 MED ORDER — LACTATED RINGERS IV SOLN: INTRAVENOUS | Status: DC

## 2023-08-12 SURGICAL SUPPLY — 15 items
BAG COUNTER SPONGE SURGICOUNT (BAG) IMPLANT
BAG SPNG CNTER NS LX DISP (BAG)
DRSG TELFA 3X8 NADH STRL (GAUZE/BANDAGES/DRESSINGS) ×1 IMPLANT
GLOVE SURG SS PI 8.0 STRL IVOR (GLOVE) IMPLANT
INST BIOPSY MAXCORE 18GX25 (NEEDLE) IMPLANT
INSTR BIOPSY MAXCORE 18GX20 (NEEDLE) IMPLANT
KIT TURNOVER KIT A (KITS) IMPLANT
NDL SAFETY ECLIP 18X1.5 (MISCELLANEOUS) IMPLANT
NDL SPNL 22GX7 QUINCKE BK (NEEDLE) IMPLANT
NEEDLE SPNL 22GX7 QUINCKE BK (NEEDLE)
PENCIL SMOKE EVACUATOR (MISCELLANEOUS) IMPLANT
SURGILUBE 2OZ TUBE FLIPTOP (MISCELLANEOUS) ×1 IMPLANT
SYR CONTROL 10ML LL (SYRINGE) IMPLANT
TOWEL OR 17X26 10 PK STRL BLUE (TOWEL DISPOSABLE) ×1 IMPLANT
UNDERPAD 30X36 HEAVY ABSORB (UNDERPADS AND DIAPERS) ×1 IMPLANT

## 2023-08-12 NOTE — Anesthesia Preprocedure Evaluation (Addendum)
Anesthesia Evaluation  Patient identified by MRN, date of birth, ID band Patient awake    Reviewed: Allergy & Precautions, NPO status , Patient's Chart, lab work & pertinent test results  History of Anesthesia Complications Negative for: history of anesthetic complications  Airway Mallampati: II  TM Distance: >3 FB Neck ROM: Full    Dental no notable dental hx.    Pulmonary neg shortness of breath, neg COPD, neg recent URI Covid-19 Nucleic Acid Test Results Lab Results      Component                Value               Date                      SARSCOV2NAA              NEGATIVE            05/02/2021              Pulmonary exam normal breath sounds clear to auscultation       Cardiovascular negative cardio ROS Normal cardiovascular exam Rhythm:Regular Rate:Normal     Neuro/Psych negative neurological ROS  negative psych ROS   GI/Hepatic negative GI ROS, Neg liver ROS,,,  Endo/Other  diabetes, Type 2    Renal/GU negative Renal ROS     Musculoskeletal  (+) Arthritis ,    Abdominal   Peds  Hematology negative hematology ROS (+)   Anesthesia Other Findings   Reproductive/Obstetrics                             Anesthesia Physical Anesthesia Plan  ASA: 2  Anesthesia Plan: MAC   Post-op Pain Management: Minimal or no pain anticipated   Induction: Intravenous  PONV Risk Score and Plan: 2 and Ondansetron, Propofol infusion, TIVA, Treatment may vary due to age or medical condition and Midazolam  Airway Management Planned: Natural Airway  Additional Equipment: None  Intra-op Plan:   Post-operative Plan:   Informed Consent: I have reviewed the patients History and Physical, chart, labs and discussed the procedure including the risks, benefits and alternatives for the proposed anesthesia with the patient or authorized representative who has indicated his/her understanding and  acceptance.     Dental advisory given  Plan Discussed with: CRNA  Anesthesia Plan Comments:         Anesthesia Quick Evaluation

## 2023-08-12 NOTE — Transfer of Care (Signed)
Immediate Anesthesia Transfer of Care Note  Patient: ELIJIAH ALTOMARI  Procedure(s) Performed: BIOPSY TRANSRECTAL ULTRASONIC PROSTATE (TUBP) (Rectum)  Patient Location: PACU  Anesthesia Type:MAC  Level of Consciousness: awake, alert , and patient cooperative  Airway & Oxygen Therapy: Patient Spontanous Breathing and Patient connected to face mask oxygen  Post-op Assessment: Report given to RN and Post -op Vital signs reviewed and stable  Post vital signs: Reviewed and stable  Last Vitals:  Vitals Value Taken Time  BP 119/68 08/12/23 1325  Temp    Pulse 65 08/12/23 1326  Resp 12 08/12/23 1326  SpO2 98 % 08/12/23 1326  Vitals shown include unfiled device data.  Last Pain:  Vitals:   08/12/23 1102  PainSc: 0-No pain         Complications: No notable events documented.

## 2023-08-12 NOTE — H&P (Signed)
07/21/23: Austin Daniel returns today in f/u. His PSA is up further to 15.1 from 8.96. His T was 289. He has not had any TRT. His IPSS is 5 with nocturia x 1. He has no associated signs or symptoms.   12/27/22 Patient is a 57 year old African-American male with single elevated PSA up to 8.96. There is no known family history of prostate cancer. Patient does have some lower urinary tract symptoms with urinary frequency and hesitancy and nocturia. Does not think urinary symptoms are bad enough to try medical therapy at this point. No prior PSAs to compare.  Micro urinalysis clear on urine spun sediment     CC: AUA Questions Scoring.  HPI: Austin Daniel is a 57 year-old male established patient who is here for evaluation.      AUA Symptom Score: Less than 20% of the time he has the sensation of not emptying his bladder completely when finished urinating. Less than 50% of the time he has to urinate again fewer than two hours after he has finished urinating. He does not have to stop and start again several times when he urinates. He never finds it difficult to postpone urination. Less than 20% of the time he has a weak urinary stream. He never has to push or strain to begin urination. He has to get up to urinate 1 time from the time he goes to bed until the time he gets up in the morning.   Calculated AUA Symptom Score: 5    ALLERGIES: No Allergies    MEDICATIONS: Atorvastatin Calcium 20 mg tablet 1 tablet PO Daily  Celecoxib 200 mg capsule 1 capsule PO Daily  Clindamycin Phosphate 1 % solution, non-oral 1 ml PO Daily  Janumet Xr 50 mg-1,000 mg tablet,extended release multiphase 24 hr 1 tablet PO Daily  Sildenafil Citrate 100 mg tablet 1 tablet PO Daily     GU PSH: Vasectomy - 2013     NON-GU PSH: Back Surgery (Unspecified) Elbow Arthroscopy, Right - 04/25/2023 Shoulder Arthroscopy/surgery, Bilateral     GU PMH: BPH w/LUTS - 12/27/2022 Elevated PSA - 12/27/2022 Nocturia - 12/27/2022 Primary  hypogonadism, Hypogonadism, testicular - 2014      PMH Notes:  1898-12-23 00:00:00 - Note: Normal Routine History And Physical Adult   NON-GU PMH: Personal history of other endocrine, nutritional and metabolic disease, History of diabetes mellitus - 2014 Diabetes Type 2 Hypercholesterolemia    FAMILY HISTORY: Diabetes - Runs In Family Family Health Status - Father alive at age 79 - Runs In Family Family Health Status - Mother's Age - Runs In Family   SOCIAL HISTORY: Marital Status: Married Ethnicity: Not Hispanic Or Latino; Race: Black or African American Current Smoking Status: Patient has never smoked.   Tobacco Use Assessment Completed: Used Tobacco in last 30 days? Does not use smokeless tobacco. Does drink.  Does not use drugs. Drinks 3 caffeinated drinks per day.     Notes: Never A Smoker, Marital History - Currently Married, Caffeine Use, Alcohol Use, Occupation:   REVIEW OF SYSTEMS:    GU Review Male:   Patient reports hard to postpone urination, get up at night to urinate, and leakage of urine. Patient denies burning/ pain with urination, stream starts and stops, trouble starting your stream, have to strain to urinate , erection problems, and penile pain.  Gastrointestinal (Upper):   Patient denies nausea, vomiting, and indigestion/ heartburn.  Gastrointestinal (Lower):   Patient denies diarrhea and constipation.  Constitutional:   6 lb with diet.  Patient reports weight loss. Patient denies fever, night sweats, and fatigue.  Skin:   Patient denies skin rash/ lesion and itching.  Eyes:   Patient denies blurred vision and double vision.  Ears/ Nose/ Throat:   Patient denies sore throat and sinus problems.  Hematologic/Lymphatic:   Patient denies swollen glands and easy bruising.  Cardiovascular:   Patient denies leg swelling and chest pains.  Respiratory:   Patient denies cough and shortness of breath.  Endocrine:   Patient denies excessive thirst.  Musculoskeletal:    Patient denies back pain and joint pain.  Neurological:   Patient denies headaches and dizziness.  Psychologic:   Patient denies depression and anxiety.   VITAL SIGNS: None   GU PHYSICAL EXAMINATION:    Anus and Perineum: No hemorrhoids. No anal stenosis. No rectal fissure, no anal fissure. No edema, no dimple, no perineal tenderness, no anal tenderness.  Prostate: Prostate 2 1/2+ size. Left lobe normal consistency, right lobe normal consistency. Symmetrical lobes. No prostate nodule. Left lobe no tenderness, right lobe no tenderness.   Seminal Vesicles: Nonpalpable.  Sphincter Tone: Normal sphincter. No rectal tenderness. No rectal mass.    MULTI-SYSTEM PHYSICAL EXAMINATION:    Constitutional: Well-nourished. No physical deformities. Normally developed. Good grooming.  Respiratory: Normal breath sounds. No labored breathing, no use of accessory muscles.   Cardiovascular: Regular rate and rhythm. No murmur, no gallop.   Lymphatic: No enlargement, no tenderness of supraclavicular lymph nodes.     Complexity of Data:  Lab Test Review:   PSA  Records Review:   AUA Symptom Score, Previous Patient Records  Urine Test Review:   Urinalysis   07/14/23 07/23/12 04/29/12  PSA  Total PSA 15.10 ng/mL 2.24  2.46     10/19/12 07/23/12 04/29/12  Hormones  Testosterone, Total 342.56  599.01  293.77     PROCEDURES:          Visit Complexity - G2211 Chronic management         Urinalysis Dipstick Dipstick Cont'd  Color: Yellow Bilirubin: Neg mg/dL  Appearance: Clear Ketones: Neg mg/dL  Specific Gravity: 1.610 Blood: Neg ery/uL  pH: 6.0 Protein: Neg mg/dL  Glucose: Neg mg/dL Urobilinogen: 0.2 mg/dL    Nitrites: Neg    Leukocyte Esterase: Neg leu/uL    ASSESSMENT:      ICD-10 Details  1 GU:   Elevated PSA - R97.20 Chronic, Worsening - His PSA is up markedly to 15.1. I will get him set up for a prostate [place] and biopsy and reviewed the risks of bleeding, infection and voiding difficulty.  He would like this done in the OR.   2   BPH w/LUTS - N40.1 Minor - He has a large benign prostate on exam with mild LUTS.  3   Nocturia - R35.1 Minor   PLAN:           Schedule Return Visit/Planned Activity: ASAP - Schedule Surgery  Procedure: Unspecified Date - Prostate Needle Biopsy - [id number] Notes: Next available

## 2023-08-12 NOTE — Discharge Instructions (Addendum)
I will call with the results of the biopsy when available.

## 2023-08-12 NOTE — Op Note (Signed)
Prostate Biopsy Procedure   Procedure  Prostrate transrectal [place] and biopsy.  Preop Dx: Elevated PSA.  Postop Dx: Same.  Surgeon: Dr. Bjorn Pippin  Anesthesia: MAC.  Drains: none.  Specimen: 13 prostate biopsy cores as noted below.   EBL: none.  Complications: none.   Pre-Procedure: - Last PSA Level: 15.6 - Prostate exam findings: 2.5+ benign  - Rocephin and Gentamicin IV given prophylactically -  -Transrectal Ultrasound performed using the probe.  - Seminal Vesicles: normal - Prostate: Symmetrical with normal PZ and TZ with the exception of a 2cm BPH appearing nodule in the left mid to apical prostate. - Prostate volume:  73.73 gm - Middle lobe: NA - Hypoechoic/Hyperechoic lesions: NA - Prostate calculi: scant  Procedure: - Prostate needle  biopsies taken from sextant areas, a total of 12 under ultrasound guidance.   1 additional cores were obtained from the left TZ nodule. Marland Kitchen   Post-Procedure: - Patient tolerated the procedure well - He was counseled to seek immediate medical attention if experiences any severe pain, significant bleeding, or fevers - He will be called with the biopsy results when available.

## 2023-08-12 NOTE — Anesthesia Procedure Notes (Signed)
Procedure Name: MAC Date/Time: 08/12/2023 12:56 PM  Performed by: Sindy Guadeloupe, CRNAPre-anesthesia Checklist: Patient identified, Emergency Drugs available, Suction available, Patient being monitored and Timeout performed Oxygen Delivery Method: Simple face mask Placement Confirmation: positive ETCO2

## 2023-08-12 NOTE — Interval H&P Note (Signed)
History and Physical Interval Note: He has no new complaints.   08/12/2023 12:45 PM  Austin Daniel  has presented today for surgery, with the diagnosis of ELEVATED PROSTATE SPECIFIC ANTIGEN.  The various methods of treatment have been discussed with the patient and family. After consideration of risks, benefits and other options for treatment, the patient has consented to  Procedure(s) with comments: BIOPSY TRANSRECTAL ULTRASONIC PROSTATE (TUBP) (N/A) - 45 MINS FOR CASE as a surgical intervention.  The patient's history has been reviewed, patient examined, no change in status, stable for surgery.  I have reviewed the patient's chart and labs.  Questions were answered to the patient's satisfaction.     Bjorn Pippin

## 2023-08-13 ENCOUNTER — Encounter (HOSPITAL_COMMUNITY): Payer: Self-pay | Admitting: Urology

## 2023-08-14 LAB — SURGICAL PATHOLOGY

## 2023-08-14 NOTE — Anesthesia Postprocedure Evaluation (Signed)
Anesthesia Post Note  Patient: Austin Daniel  Procedure(s) Performed: BIOPSY TRANSRECTAL ULTRASONIC PROSTATE (TUBP) (Rectum)     Patient location during evaluation: PACU Anesthesia Type: MAC Level of consciousness: awake and alert Pain management: pain level controlled Vital Signs Assessment: post-procedure vital signs reviewed and stable Respiratory status: spontaneous breathing Cardiovascular status: stable Anesthetic complications: no   No notable events documented.  Last Vitals:  Vitals:   08/12/23 1400 08/12/23 1415  BP: (!) 130/98 (!) 128/92  Pulse: 63 (!) 50  Resp: 15   Temp: (!) 36.3 C (!) 36.3 C  SpO2: 100% 100%    Last Pain:  Vitals:   08/12/23 1415  PainSc: 0-No pain                 Lewie Loron

## 2023-08-20 ENCOUNTER — Other Ambulatory Visit (HOSPITAL_COMMUNITY): Payer: Self-pay | Admitting: Urology

## 2023-08-20 DIAGNOSIS — C61 Malignant neoplasm of prostate: Secondary | ICD-10-CM

## 2023-08-28 ENCOUNTER — Telehealth: Payer: Self-pay

## 2023-08-28 NOTE — Telephone Encounter (Signed)
I called pt to introduce myself as the  Coordinator of the Prostate [facility].   1. I confirmed with the patient he is aware of his referral to the clinic 9/20, arriving @ 8:30 am.    2. I discussed the format of the clinic and the physicians he will be seeing that day.   3. I discussed where the clinic is located and how to contact me.   4. I confirmed his address and informed him I would be mailing a packet of information and forms to be completed. I asked him to bring them with him the day of his appointment.    He voiced understanding of the above. I asked him to call me if he has any questions or concerns regarding his appointments or the forms he needs to complete.

## 2023-09-08 ENCOUNTER — Ambulatory Visit (HOSPITAL_COMMUNITY)
Admission: RE | Admit: 2023-09-08 | Discharge: 2023-09-08 | Disposition: A | Discharge status: Home / Self Care | Payer: BC Managed Care – PPO | Source: Ambulatory Visit | Attending: Urology | Admitting: Urology

## 2023-09-08 DIAGNOSIS — C61 Malignant neoplasm of prostate: Secondary | ICD-10-CM | POA: Diagnosis present

## 2023-09-08 MED ORDER — FLOTUFOLASTAT F 18 GALLIUM 296-5846 MBQ/ML IV SOLN
7.8680 | Freq: Once | INTRAVENOUS | Status: AC
  Administered 2023-09-08: 7.868 via INTRAVENOUS

## 2023-09-09 NOTE — Progress Notes (Signed)
[facility] CONSULT NOTE  Patient Care Team: Renaye Rakers, MD as PCP - General (Family Medicine) Cherlyn Cushing, RN as Oncology Nurse Navigator  ASSESSMENT & PLAN:  Austin Daniel is a 57 y.o.male with history of DM2 being seen at Medical Oncology Clinic for prostate cancer.  Primary prostate adenocarcinoma (HCC) Only one core with carcinoma, T1c cN0M0 3+3 =6, GG1. PSA of 15 put him in favorable intermediate risk group.  It was pleasant to meet Austin Daniel and his wife together today.  We discussed his case at tumor board today.  His PET/CT showed large area of prostate malignancy and his PSA is rising rather quickly for a 3+3 Gleason 6 pathology.  There is no signs of distant metastasis.  Patient will proceed with prostate mpMRI and evaluation is recommended by radiation oncology and urologic oncology for further management.  He may follow-up with medical oncology as needed.  No orders of the defined types were placed in this encounter.  All questions were answered. The patient knows to call the clinic with any problems, questions or concerns. No barriers to learning was detected.  Melven Sartorius, MD 9/20/202410:22 AM  CHIEF COMPLAINTS/PURPOSE OF CONSULTATION:  Prostate cancer  HISTORY OF PRESENTING ILLNESS:  Austin Daniel 57 y.o. male is here because of prostate cancer I have reviewed his chart and materials related to his cancer extensively and collaborated history with the patient. Summary of oncologic history is as follows:  Oncology History  Primary prostate adenocarcinoma (HCC)  12/19/2022 Tumor Marker   PSA 8.96   07/15/2023 Tumor Marker   PSA 15.1   07/15/2023 Cancer Staging   Staging form: Prostate, AJCC 8th Edition - Clinical stage from 07/15/2023: Stage IIA (cT1c, cN0, cM0, PSA: 15.1, Grade Group: 1) - Signed by Marcello Fennel, PA-C on 09/12/2023 Histopathologic type: Adenocarcinoma, NOS Stage prefix: Initial diagnosis Prostate specific antigen (PSA) range: 10 to  19 Gleason primary pattern: 3 Gleason secondary pattern: 3 Gleason score: 6 Histologic grading system: 5 grade system Number of biopsy cores examined: 12 Number of biopsy cores positive: 1 Location of positive needle core biopsies: One side   08/12/2023 Surgery   Prostate biopsy One core + PROSTATE, LEFT MID LATERAL, BIOPSY:  Two small foci of prostatic adenocarcinoma, Gleason score 3+3=6 (grade group 1) discontinuously involves 10% of one core. Cancer length 0.1 cm    09/08/2023 Imaging   PSMA PET IMPRESSION: 1. Large region of intense radiotracer activity in the LEFT lobe of the prostate gland consistent primary prostate adenocarcinoma. 2. No evidence of pelvic lymphadenopathy or periaortic retroperitoneal lymphadenopathy. 3. No evidence of visceral metastasis or skeletal metastasis.   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   left   Dental crowns present    High cholesterol    Pre-diabetes    Shoulder impingement 04/2013   left    SURGICAL HISTORY: Past Surgical History:  Procedure Laterality Date   ELBOW ARTHROSCOPY WITH TENDON RECONSTRUCTION  04/2023   FOREIGN BODY REMOVAL  11/12/2011   Procedure: FOREIGN BODY REMOVAL ADULT;  Surgeon: Wyn Forster., MD;  Location: [facility];  Service: Orthopedics;  Laterality: Right;  right long finger   LASIK Bilateral    LUMBAR LAMINECTOMY/DECOMPRESSION MICRODISCECTOMY Right 05/03/2021   Procedure: RIGHT-SIDED LUMBAR FIVE- SACRUM ONE MICRODISECTOMY;  Surgeon:  Estill Bamberg, MD;  Location: [facility];  Service: Orthopedics;  Laterality: Right;   PROSTATE BIOPSY N/A 08/12/2023   Procedure: BIOPSY TRANSRECTAL ULTRASONIC PROSTATE (TUBP);  Surgeon: Bjorn Pippin, MD;  Location: [facility];  Service: Urology;  Laterality: N/A;  45  MINS FOR CASE   SHOULDER ARTHROSCOPY Right    SHOULDER ARTHROSCOPY Left    2 separate surgeries on each shoulder   SHOULDER ARTHROSCOPY W/ ROTATOR CUFF REPAIR Right 04/16/2001   SHOULDER ARTHROSCOPY WITH ROTATOR CUFF REPAIR AND SUBACROMIAL DECOMPRESSION Left 04/29/2013   Procedure: LEFT SHOULDER ARTHROSCOPY WITH SUBACROMIAL DECOMPRESSION, PARTIAL ACROMIOPLASTY, WITH CORACOACROMIAL RELEASE, DISTAL CLAVICULECTOMY, WITH ROTATOR CUFF REPAIR;  Surgeon: Loreta Ave, MD;  Location: [facility];  Service: Orthopedics;  Laterality: Left;    SOCIAL HISTORY: Social History   Socioeconomic History   Marital status: Married    Spouse name: Not on file   Number of children: Not on file   Years of education: Not on file   Highest education level: Not on file  Occupational History   Not on file  Tobacco Use   Smoking status: Never   Smokeless tobacco: Never  Vaping Use   Vaping status: Never Used  Substance and Sexual Activity   Alcohol use: Yes    Comment: occasionally   Drug use: No   Sexual activity: Yes  Other Topics Concern   Not on file  Social History Narrative   Not on file   Social Determinants of Health   Financial Resource Strain: Not on file  Food Insecurity: No Food Insecurity (08/16/2022)   Received from [facility], [facility]   Hunger Vital Sign    Worried About Running Out of Food in the Last Year: Never true    Ran Out of Food in the Last Year: Never true  Transportation Needs: Not on file  Physical Activity: Not on file  Stress: Not on file  Social Connections: Unknown (05/05/2022)   Received from [facility], [facility]   Social Network    Social Network: Not on file  Intimate Partner Violence: Unknown (03/27/2022)   Received from [facility], [facility]   HITS    Physically Hurt: Not on file    Insult or Talk Down To: Not on file    Threaten Physical Harm: Not on file    Scream or Curse: Not on file    FAMILY HISTORY: No  family history on file.  ALLERGIES:  has No Known Allergies.  MEDICATIONS:  Current Outpatient Medications  Medication Sig Dispense Refill   Black Currant Seed Oil 500 MG CAPS Take 500 mg by mouth daily.     celecoxib (CELEBREX) 200 MG capsule Take 1 capsule by mouth 2 (two) times daily.     Cholecalciferol (VITAMIN D3) 250 MCG (10000 UT) capsule Take 10,000 Units by mouth daily.     DHEA 25 MG CAPS Take 25 mg by mouth daily.     JANUMET XR 50-1000 MG TB24 Take 1 tablet by mouth daily.     Menaquinone-7 (VITAMIN K2 PO) Take 300 mcg by mouth daily.     Misc Natural Products (PUMPKIN SEED OIL PO) Take 1,000 mg by mouth daily.     Multiple Vitamins-Minerals (MULTIVITAMIN WITH MINERALS) tablet Take 1 tablet by mouth daily.     OVER THE COUNTER MEDICATION Take 1,200 mg by mouth daily. Honey goat weed     OVER THE COUNTER MEDICATION Take 1 Scoop by mouth daily. Organic wheat Grass power With  organic Beet root power in a bottle of water     PINE BARK, PYCNOGENOL, PO Take 100 mg by mouth daily.     sildenafil (VIAGRA) 100 MG tablet Take 100 mg by mouth daily as needed for erectile dysfunction.     VITAMIN E PO Take 465 Units by mouth daily.     No current facility-administered medications for this visit.    REVIEW OF SYSTEMS:   GU: Denies any urinary changes, dysuria, hematuria prior to diagnosis  PHYSICAL EXAMINATION: ECOG PERFORMANCE STATUS: 0 - Asymptomatic  There were no vitals filed for this visit. There were no vitals filed for this visit.  GENERAL: alert, no distress and comfortable  LABORATORY DATA:  I have reviewed the data as listed Lab Results  Component Value Date   WBC 3.8 (L) 08/04/2023   HGB 15.3 08/04/2023   HCT 44.1 08/04/2023   MCV 87.8 08/04/2023   PLT 201 08/04/2023   Recent Labs    08/04/23 0824  NA 137  K 4.0  CL 103  CO2 25  GLUCOSE 103*  BUN 15  CREATININE 1.19  CALCIUM 9.0  GFRNONAA >60    RADIOGRAPHIC STUDIES: I have personally reviewed  the radiological images as listed and agreed with the findings in the report. NM PET (PSMA) SKULL TO MID THIGH  Result Date: 09/10/2023 CLINICAL DATA:  High-risk prostate cancer staging. EXAM: NUCLEAR MEDICINE PET SKULL BASE TO THIGH TECHNIQUE: 7.8 mCi Flotufolastat (Posluma) was injected intravenously. Full-ring PET imaging was performed from the skull base to thigh after the radiotracer. CT data was obtained and used for attenuation correction and anatomic localization. COMPARISON:  None Available. FINDINGS: NECK No radiotracer activity in neck lymph nodes. Incidental CT finding: None. CHEST No radiotracer accumulation within mediastinal or hilar lymph nodes. No suspicious pulmonary nodules on the CT scan. Incidental CT finding: None. ABDOMEN/PELVIS Prostate: Intense radiotracer activity within the LEFT lobe of the prostate gland with SUV max equal 8.9. Large region of involvement. Seminal vesicles appear uninvolved. Lymph nodes: No abnormal radiotracer accumulation within pelvic or abdominal nodes. Liver: No evidence of liver metastasis. Incidental CT finding: None. SKELETON No focal activity to suggest skeletal metastasis. IMPRESSION: 1. Large region of intense radiotracer activity in the LEFT lobe of the prostate gland consistent primary prostate adenocarcinoma. 2. No evidence of pelvic lymphadenopathy or periaortic retroperitoneal lymphadenopathy. 3. No evidence of visceral metastasis or skeletal metastasis. Electronically Signed   By: Genevive Bi M.D.   On: 09/10/2023 16:14

## 2023-09-11 DIAGNOSIS — C61 Malignant neoplasm of prostate: Secondary | ICD-10-CM | POA: Insufficient documentation

## 2023-09-11 NOTE — Progress Notes (Signed)
Radiation Oncology         [phone]) [phone] ________________________________  Multidisciplinary Prostate Cancer Clinic  Initial Radiation Oncology Consultation  Name: Austin Daniel MRN: [id number]  Date: 09/12/2023  DOB: August 15, 1966  JY:NWGNF, Adrian Saran, MD  Bjorn Pippin, MD   REFERRING PHYSICIAN: Bjorn Pippin, MD  DIAGNOSIS: 57 y.o. gentleman with stage T1c adenocarcinoma of the prostate with a Gleason's score of 3+3 and a PSA of 15.1    ICD-10-CM   1. Primary prostate adenocarcinoma (HCC)  C61       HISTORY OF PRESENT ILLNESS::Austin Daniel is a 57 y.o. gentleman.  He was noted to have an elevated PSA of 8.96 on routine labs with his primary care physician, Dr. Parke Simmers.  Accordingly, he was referred for evaluation in urology by Dr. Benancio Deeds on 12/27/22,  digital rectal examination performed at that time showed no nodules. He opted for surveillance with repeat labs in 6 months.  He had a repeat PSA performed on 07/15/23, which was further elevated at 15.1.  He had a follow-up visit with Dr. Annabell Howells and elected to proceed with transrectal ultrasound with 12 biopsies of the prostate under anesthesia, performed on 08/12/23.  The prostate volume measured 73.73 cc.  Out of 12 core biopsies, only 1 was positive.  The maximum Gleason score was 3+3, and this was seen in the left mid lateral (10% involvement).       Given the rapid rise and significantly elevated PSA, he underwent staging PSMA PET scan on 09/08/23 showing a large region of intense radiotracer activity in the left lobe of the prostate gland without evidence of lymphadenopathy, visceral metastasis, or skeletal metastasis.    The patient reviewed the biopsy and imaging results with his urologist and he has kindly been referred today to the multidisciplinary prostate cancer clinic for presentation of pathology and radiology studies in our conference for discussion of potential radiation treatment options and clinical evaluation.  He is accompanied  by his wife, Austin Daniel, for today's visit.  PREVIOUS RADIATION THERAPY: No  PAST MEDICAL HISTORY:  has a past medical history of Complete rupture of rotator cuff (04/2013), Degenerative arthritis of shoulder region (04/2013), Dental crowns present, High cholesterol, Pre-diabetes, and Shoulder impingement (04/2013).    PAST SURGICAL HISTORY: Past Surgical History:  Procedure Laterality Date   ELBOW ARTHROSCOPY WITH TENDON RECONSTRUCTION  04/2023   FOREIGN BODY REMOVAL  11/12/2011   Procedure: FOREIGN BODY REMOVAL ADULT;  Surgeon: Wyn Forster., MD;  Location: [facility];  Service: Orthopedics;  Laterality: Right;  right long finger   LASIK Bilateral    LUMBAR LAMINECTOMY/DECOMPRESSION MICRODISCECTOMY Right 05/03/2021   Procedure: RIGHT-SIDED LUMBAR FIVE- SACRUM ONE MICRODISECTOMY;  Surgeon: Estill Bamberg, MD;  Location: [facility];  Service: Orthopedics;  Laterality: Right;   PROSTATE BIOPSY N/A 08/12/2023   Procedure: BIOPSY TRANSRECTAL ULTRASONIC PROSTATE (TUBP);  Surgeon: Bjorn Pippin, MD;  Location: [facility];  Service: Urology;  Laterality: N/A;  45 MINS FOR CASE   SHOULDER ARTHROSCOPY Right    SHOULDER ARTHROSCOPY Left    2 separate surgeries on each shoulder   SHOULDER ARTHROSCOPY W/ ROTATOR CUFF REPAIR Right 04/16/2001   SHOULDER ARTHROSCOPY WITH ROTATOR CUFF REPAIR AND SUBACROMIAL DECOMPRESSION Left 04/29/2013   Procedure: LEFT SHOULDER ARTHROSCOPY WITH SUBACROMIAL DECOMPRESSION, PARTIAL ACROMIOPLASTY, WITH CORACOACROMIAL RELEASE, DISTAL CLAVICULECTOMY, WITH ROTATOR CUFF REPAIR;  Surgeon: Loreta Ave, MD;  Location: [facility];  Service: Orthopedics;  Laterality: Left;    FAMILY HISTORY: family history is not  on file.  SOCIAL HISTORY:  reports that he has never smoked. He has never used smokeless tobacco. He reports current alcohol use. He reports that he does not use drugs.  ALLERGIES: Patient has no known allergies.  MEDICATIONS:  Current  Outpatient Medications  Medication Sig Dispense Refill   Black Currant Seed Oil 500 MG CAPS Take 500 mg by mouth daily.     celecoxib (CELEBREX) 200 MG capsule Take 1 capsule by mouth 2 (two) times daily.     Cholecalciferol (VITAMIN D3) 250 MCG (10000 UT) capsule Take 10,000 Units by mouth daily.     DHEA 25 MG CAPS Take 25 mg by mouth daily.     JANUMET XR 50-1000 MG TB24 Take 1 tablet by mouth daily.     Menaquinone-7 (VITAMIN K2 PO) Take 300 mcg by mouth daily.     Misc Natural Products (PUMPKIN SEED OIL PO) Take 1,000 mg by mouth daily.     Multiple Vitamins-Minerals (MULTIVITAMIN WITH MINERALS) tablet Take 1 tablet by mouth daily.     OVER THE COUNTER MEDICATION Take 1,200 mg by mouth daily. Honey goat weed     OVER THE COUNTER MEDICATION Take 1 Scoop by mouth daily. Organic wheat Grass power With organic Beet root power in a bottle of water     PINE BARK, PYCNOGENOL, PO Take 100 mg by mouth daily.     sildenafil (VIAGRA) 100 MG tablet Take 100 mg by mouth daily as needed for erectile dysfunction.     VITAMIN E PO Take 465 Units by mouth daily.     No current facility-administered medications for this encounter.    REVIEW OF SYSTEMS:  On review of systems, the patient reports that he is doing well overall. He denies any chest pain, shortness of breath, cough, fevers, chills, night sweats, unintended weight changes. He denies any bowel disturbances, and denies abdominal pain, nausea or vomiting. He denies any new musculoskeletal or joint aches or pains. His IPSS was 10, indicating mild-moderate urinary symptoms. His SHIM was 24, indicating he does not have erectile dysfunction. A complete review of systems is obtained and is otherwise negative.   PHYSICAL EXAM:  Wt Readings from Last 3 Encounters:  09/12/23 225 lb 4 oz (102.2 kg)  08/12/23 220 lb 7.4 oz (100 kg)  08/04/23 222 lb (100.7 kg)   Temp Readings from Last 3 Encounters:  09/12/23 (!) 96.9 F (36.1 C) (Temporal)  08/12/23  97.9 F (36.6 C) (Oral)  08/12/23 (!) 97.3 F (36.3 C)   BP Readings from Last 3 Encounters:  09/12/23 (!) 147/90  08/12/23 (!) 143/89  08/12/23 (!) 128/92   Pulse Readings from Last 3 Encounters:  09/12/23 73  08/12/23 (!) 53  08/12/23 (!) 50    /10  In general this is a well appearing African-American man in no acute distress. He's alert and oriented x4 and appropriate throughout the examination. Cardiopulmonary assessment is negative for acute distress and he exhibits normal effort.    KPS = 100  100 - Normal; no complaints; no evidence of disease. 90   - Able to carry on normal activity; minor signs or symptoms of disease. 80   - Normal activity with effort; some signs or symptoms of disease. 73   - Cares for self; unable to carry on normal activity or to do active work. 60   - Requires occasional assistance, but is able to care for most of his personal needs. 50   - Requires considerable assistance  and frequent medical care. 40   - Disabled; requires special care and assistance. 30   - Severely disabled; hospital admission is indicated although death not imminent. 20   - Very sick; hospital admission necessary; active supportive treatment necessary. 10   - Moribund; fatal processes progressing rapidly. 0     - Dead  Karnofsky DA, Abelmann WH, Craver LS and Burchenal JH [phone]) The use of the nitrogen mustards in the palliative treatment of carcinoma: with particular reference to bronchogenic carcinoma Cancer 1 [id number]   LABORATORY DATA:  Lab Results  Component Value Date   WBC 3.8 (L) 08/04/2023   HGB 15.3 08/04/2023   HCT 44.1 08/04/2023   MCV 87.8 08/04/2023   PLT 201 08/04/2023   Lab Results  Component Value Date   NA 137 08/04/2023   K 4.0 08/04/2023   CL 103 08/04/2023   CO2 25 08/04/2023   Lab Results  Component Value Date   ALT 25 05/02/2021   AST 21 05/02/2021   ALKPHOS 31 (L) 05/02/2021   BILITOT 0.5 05/02/2021     RADIOGRAPHY: NM PET (PSMA)  SKULL TO MID THIGH  Result Date: 09/10/2023 CLINICAL DATA:  High-risk prostate cancer staging. EXAM: NUCLEAR MEDICINE PET SKULL BASE TO THIGH TECHNIQUE: 7.8 mCi Flotufolastat (Posluma) was injected intravenously. Full-ring PET imaging was performed from the skull base to thigh after the radiotracer. CT data was obtained and used for attenuation correction and anatomic localization. COMPARISON:  None Available. FINDINGS: NECK No radiotracer activity in neck lymph nodes. Incidental CT finding: None. CHEST No radiotracer accumulation within mediastinal or hilar lymph nodes. No suspicious pulmonary nodules on the CT scan. Incidental CT finding: None. ABDOMEN/PELVIS Prostate: Intense radiotracer activity within the LEFT lobe of the prostate gland with SUV max equal 8.9. Large region of involvement. Seminal vesicles appear uninvolved. Lymph nodes: No abnormal radiotracer accumulation within pelvic or abdominal nodes. Liver: No evidence of liver metastasis. Incidental CT finding: None. SKELETON No focal activity to suggest skeletal metastasis. IMPRESSION: 1. Large region of intense radiotracer activity in the LEFT lobe of the prostate gland consistent primary prostate adenocarcinoma. 2. No evidence of pelvic lymphadenopathy or periaortic retroperitoneal lymphadenopathy. 3. No evidence of visceral metastasis or skeletal metastasis. Electronically Signed   By: Genevive Bi M.D.   On: 09/10/2023 16:14      IMPRESSION/PLAN: 57 y.o. gentleman with Stage T1c adenocarcinoma of the prostate with a Gleason score of 3+3 and a PSA of 15.1.    We discussed the patient's workup and outlined the nature of prostate cancer in this setting. The patient's T stage, Gleason's score, and PSA put him into the intermediate risk group.  Given the incongruency between his PSA and PSMA PET findings and the low volume Gleason 3+3 on biopsy, consensus recommendation is to further evaluate with a prostate MRI followed by a confirmatory,  targeted MRI fusion biopsy or a confirmatory saturation biopsy if the prostate MRI is negative.  Pending those findings, we would be better able to determine the most appropriate treatment options.  If it is indeed confirms that he has low volume, intermediate risk disease, he would be eligible for a variety of treatment options, including  brachytherapy, 5.5 weeks of external radiation, or prostatectomy. We discussed the available radiation techniques, and focused on the details and logistics of delivery. The patient is not an ideal candidate for brachytherapy/boost with a prostate volume of 74 cc. We discussed that based on his prostate volume, he would require beginning treatment with  a 5 alpha reductase inhibitor for at least 3 months, to allow for downsizing of the prostate prior to initiating brachytherapy. We discussed and outlined the risks, benefits, short and long-term effects associated with radiotherapy and compared and contrasted these with prostatectomy. We discussed the role of SpaceOAR gel in reducing the rectal toxicity associated with radiotherapy. He appears to have a good understanding of our ecommendations and is in agreement with the stated plan.  He and his wife were encouraged to ask questions that were answered to their stated satisfaction.  At the end of the conversation the patient is interested in moving forward with prostate MRI followed by confirmatory biopsy under anesthesia, so we will share our discussion with Dr. Annabell Howells so that this can be arranged in the near future.  We enjoyed meeting him and his wife today and look forward to following along in his care.  We would be more than happy to see him back for further discussion of radiation treatment options if deemed appropriate up on confirmatory biopsy.  They know that they are welcome to call us at anytime with any questions or concerns related to our discussion today.   We personally spent 60 minutes in this encounter  including chart review, reviewing radiological studies, meeting face-to-face with the patient, entering orders and completing documentation.    Marguarite Arbour, PA-C    Margaretmary Dys, MD  [facility]  Radiation Oncology Direct Dial: [phone]  Fax: [phone] [email or web address]  Skype  LinkedIn   This document serves as a record of services personally performed by Margaretmary Dys, MD and Marcello Fennel, PA-C. It was created on their behalf by Mickie Bail, a trained medical scribe. The creation of this record is based on the scribe's personal observations and the provider's statements to them. This document has been checked and approved by the attending provider.

## 2023-09-11 NOTE — Progress Notes (Signed)
RN spoke with patient to confirm upcoming PMDC appointment.

## 2023-09-12 ENCOUNTER — Encounter: Payer: Self-pay | Admitting: Radiation Oncology

## 2023-09-12 ENCOUNTER — Inpatient Hospital Stay: Payer: BC Managed Care – PPO

## 2023-09-12 ENCOUNTER — Telehealth: Payer: Self-pay | Admitting: Genetic Counselor

## 2023-09-12 ENCOUNTER — Ambulatory Visit
Admission: RE | Admit: 2023-09-12 | Discharge: 2023-09-12 | Disposition: A | Discharge status: Home / Self Care | Payer: BC Managed Care – PPO | Source: Ambulatory Visit | Attending: Radiation Oncology | Admitting: Radiation Oncology

## 2023-09-12 VITALS — BP 147/90 | HR 73 | Temp 96.9°F | Resp 18 | Ht 73.0 in | Wt 225.2 lb

## 2023-09-12 DIAGNOSIS — C61 Malignant neoplasm of prostate: Secondary | ICD-10-CM

## 2023-09-12 DIAGNOSIS — E119 Type 2 diabetes mellitus without complications: Secondary | ICD-10-CM | POA: Diagnosis not present

## 2023-09-12 NOTE — Assessment & Plan Note (Signed)
Only one core with carcinoma, T1c cN0M0 3+3 =6, GG1. PSA of 15 put him in favorable intermediate risk group.

## 2023-09-12 NOTE — Telephone Encounter (Signed)
Austin Daniel was seen by a genetic counselor during the prostate multidisciplinary clinic on 09/12/2023. In addition to his personal history of prostate cancer, he reported a family history of a mother with breast cancer diagnosed at age 59. He does not meet NCCN criteria for genetic testing at this time. He was still offered genetic counseling and testing but declined. We encourage him to contact us if there are any changes to his personal or family history of cancer. If he meets NCCN criteria based on the updated personal/family history, he would be recommended to have genetic counseling and testing.   Lalla Brothers, MS, [facility] Genetic Counselor [email or web address] (P) [phone]

## 2023-09-12 NOTE — Progress Notes (Addendum)
Care Plan Summary  Name: Austin Daniel DOB: [birth year]   Your Medical Team:   Urologist -  Dr. Heloise Purpura, [facility]  Radiation Oncologist - Dr. Margaretmary Dys, [facility]   Medical Oncologist - Dr. Geanie Berlin, [facility]  Recommendations: 1) MRI  2) Follow up with Dr. Laverle Patter      * These recommendations are based on information available as of today's consult.      Recommendations may change depending on the results of further tests or exams.    Next Steps: 1) MRI  2) Follow up with Dr. Laverle Patter   When appointments need to be scheduled, you will be contacted by [facility] and/or [facility].  Questions?  Please do not hesitate to call Cherlyn Cushing, BSN, RN at [phone] with any questions or concerns.  Marisue Ivan is your Oncology Nurse Navigator and is available to assist you while you're receiving your medical care at [facility].

## 2023-09-15 ENCOUNTER — Other Ambulatory Visit: Payer: Self-pay | Admitting: Urology

## 2023-09-15 DIAGNOSIS — C61 Malignant neoplasm of prostate: Secondary | ICD-10-CM

## 2023-09-15 NOTE — Addendum Note (Signed)
Encounter addended by: Heloise Purpura, MD on: 09/15/2023 7:21 AM  Actions taken: Clinical Note Signed

## 2023-09-15 NOTE — Consult Note (Signed)
Multi-Disciplinary Clinic     09/12/2023   --------------------------------------------------------------------------------   Austin Daniel  MRN: [id number]  DOB: 1966/10/05, 57 year old Male  SSN: [id number]   PRIMARY CARE:  Austin Rakers, MD  PRIMARY CARE FAX:  [phone]  REFERRING:  Austin Pippin, MD  PROVIDER:  Bjorn Daniel, M.D.  TREATING:  Austin Daniel, M.D.  LOCATION:  [facility] [phone]     --------------------------------------------------------------------------------   CC/HPI: CC: Prostate Cancer   Physician requesting consult: Dr. Bjorn Daniel  PCP: Dr. Renaye Daniel  Location of consult: [facility] Pro[facility] Cancer Multidisciplinary Clinic   Mr. Austin Daniel is a 57 year old gentleman who was noted to have an elevated PSA of 8.96 in December 2023 that was up from a baseline level of 2.4 in 2013. He elected to monitor and recheck this and it further increased to 15.1 in July 2024 prompting a TRUS biopsy of the prostate on 08/12/23 which demonstrated 1 out of 13 biopsies positive for Gleason 3+3=6 adenocarcinoma.   Family history: None.   Imaging studies: He did undergo a PSMA PET scan on 09/10/2023. This demonstrated no evidence of metastatic disease but a very large area of avidity encompassing the majority of the left side of the prostate.   PMH: He has a history of diabetes and hyperlipidemia.  PSH: No abdominal surgeries.   TNM stage: cT1c Nx Mx  PSA: 15.1  Gleason score: 3+3=6 (GG 1)  Biopsy (08/12/23): 1/13 cores positive  Left: L lateral mid (10%, 3+3=6)  Right: Benign  TZ: Benign  Prostate volume: 73.7 cc  PSAD: 0.20   Nomogram  OC disease: 74%  EPE: 24%  SVI: 1%  LNI: 2%  PFS (5 year, 10 year): 90%, 83%   Urinary function: IPSS is 10.  Erectile function: SHIM score is 24.     ALLERGIES: No Allergies    MEDICATIONS: Atorvastatin Calcium 20 mg tablet 1 tablet PO Daily  Celecoxib 200 mg capsule 1 capsule PO Daily   Clindamycin Phosphate 1 % solution, non-oral 1 ml PO Daily  Janumet Xr 50 mg-1,000 mg tablet,extended release multiphase 24 hr 1 tablet PO Daily  Sildenafil Citrate 100 mg tablet 1 tablet PO Daily     GU PSH: Vasectomy - 2013     NON-GU PSH: Back Surgery (Unspecified) Elbow Arthroscopy, Right - 04/25/2023 Shoulder Arthroscopy/surgery, Bilateral Visit Complexity (formerly GPC1X) - 07/21/2023     GU PMH: BPH w/LUTS, He has mild LUTS with a 73ml prostate. - 08/20/2023, He has a large benign prostate on exam with mild LUTS., - 07/21/2023, - 12/27/2022 Elevated PSA - 08/20/2023, His PSA is up markedly to 15.1. I will get him set up for a prostate [place] and biopsy and reviewed the risks of bleeding, infection and voiding difficulty. He would like this done in the OR. , - 07/21/2023, - 12/27/2022 Nocturia - 08/20/2023, - 07/21/2023, - 12/27/2022 Prostate Cancer, He has T1c Nx Mx GG1 small volume prostate cancer but his PSA is rising rapidly and at 15 he has intermediate risk disease. Because of the level and rapid rise of the PSA, I have ordered a PMSA PET scan. If that is negative he could be a candidate for surveillance but at 57 I would be concerned about that option. His prostate is too big for brachytherapy. I discussed RALP and XRT with [employer] and fiducials in some detail and gave him handouts. I have ordered a PSMA PET to make sure he  doesn't have uptake in the prostate that would suggest a focus of missed higher grade disease or mets. I will get him seen in the [facility] to discuss options further. - 08/20/2023 Primary hypogonadism, Hypogonadism, testicular - 2014      PMH Notes:  1898-12-23 00:00:00 - Note: Normal Routine History And Physical Adult   NON-GU PMH: Personal history of other endocrine, nutritional and metabolic disease, History of diabetes mellitus - 2014 Diabetes Type 2 Hypercholesterolemia    FAMILY HISTORY: Diabetes - Runs In Family Family Health Status - Father alive at age 25 - Runs In  Family Family Health Status - Mother's Age - Runs In Family   SOCIAL HISTORY: Marital Status: Married Ethnicity: Not Hispanic Or Latino; Race: Black or African American Current Smoking Status: Patient has never smoked.   Tobacco Use Assessment Completed: Used Tobacco in last 30 days? Does not use smokeless tobacco. Does drink.  Does not use drugs. Drinks 3 caffeinated drinks per day.     Notes: Never A Smoker, Marital History - Currently Married, Caffeine Use, Alcohol Use, Occupation:   REVIEW OF SYSTEMS:    GU Review Male:   Patient denies frequent urination, hard to postpone urination, burning/ pain with urination, get up at night to urinate, leakage of urine, stream starts and stops, trouble starting your streams, and have to strain to urinate .  Gastrointestinal (Upper):   Patient denies nausea and vomiting.  Gastrointestinal (Lower):   Patient denies diarrhea and constipation.  Constitutional:   Patient denies fever, night sweats, weight loss, and fatigue.  Skin:   Patient denies skin rash/ lesion and itching.  Eyes:   Patient denies blurred vision and double vision.  Ears/ Nose/ Throat:   Patient denies sore throat and sinus problems.  Hematologic/Lymphatic:   Patient denies swollen glands and easy bruising.  Cardiovascular:   Patient denies leg swelling and chest pains.  Respiratory:   Patient denies cough and shortness of breath.  Endocrine:   Patient denies excessive thirst.  Musculoskeletal:   Patient denies back pain and joint pain.  Neurological:   Patient denies headaches and dizziness.  Psychologic:   Patient denies depression and anxiety.   VITAL SIGNS: None   GU PHYSICAL EXAMINATION:    Prostate: Prostate about 60 grams. Left lobe normal consistency, right lobe normal consistency. Symmetrical lobes. No prostate nodule. Left lobe no tenderness, right lobe no tenderness.    MULTI-SYSTEM PHYSICAL EXAMINATION:    Constitutional: Well-nourished. No physical  deformities. Normally developed. Good grooming.     Complexity of Data:  Lab Test Review:   PSA  Records Review:   Pathology Reports, Previous Patient Records  X-Ray Review: PET- PSMA Scan: Reviewed Films.     07/14/23 12/19/22 07/23/12 04/29/12  PSA  Total PSA 15.10 ng/mL 8.96 ng/ml 2.24  2.46     10/19/12 07/23/12 04/29/12  Hormones  Testosterone, Total 342.56  599.01  293.77     PROCEDURES: None   ASSESSMENT:      ICD-10 Details  1 GU:   Prostate Cancer - C61    PLAN:           Orders X-Rays: MRI Prostate GSORAD With and Without I.V. Contrast - Favorable intermediate risk prostate cancer with rising PSA. Please evaluate further.          Schedule         Document Letter(s):  Created for Patient: Clinical Summary         Notes:   1. Favorable intermediate  risk prostate cancer: I had a detailed discussion with Mr. Inclan and his wife today in the multidisciplinary clinic. He is meeting with Dr. Kathrynn Running and Dr. Cherly Hensen this morning as well.   We discussed that his prostate cancer parameters are somewhat conflicting. Although his biopsy information suggest a very low volume and low grade cancer, his PSA level, PSA velocity, and PSMA PET scan imaging findings are quite concerning for more significant disease.   The patient was counseled about the natural history of prostate cancer and the standard treatment options that are available for prostate cancer. It was explained to him how his age and life expectancy, clinical stage, Gleason score/prognostic grade group, and PSA (and PSA density) affect his prognosis, the decision to proceed with additional staging studies, as well as how that information influences recommended treatment strategies. We discussed the roles for active surveillance, radiation therapy, surgical therapy, androgen deprivation, as well as ablative therapy and other investigational options for the treatment of prostate cancer as appropriate to his individual  cancer situation. We discussed the risks and benefits of these options with regard to their impact on cancer control and also in terms of potential adverse events, complications, and impact on quality of life particularly related to urinary and sexual function. The patient was encouraged to ask questions throughout the discussion today and all questions were answered to his stated satisfaction. In addition, the patient was provided with and/or directed to appropriate resources and literature for further education about prostate cancer and treatment options.   After discussion, we have agreed to proceed with further evaluation with an MRI of the prostate in the next month. If this is highly concerning for significant disease and if he does wish to proceed with surgical therapy, I do not think he necessarily needs to undergo further evaluation with a repeat confirmatory biopsy although that remains an option. If he wishes to proceed with radiation therapy, I would strongly recommend that he had a confirmatory biopsy to best assess his risk stratification for appropriate therapy. If his MRI is negative, I would recommend that he simply proceed with a confirmatory biopsy with additional sampling to rule out higher risk disease. I will likely repeat his PSA at his follow-up when he comes to return for his MRI as well.   CC: Dr. Bjorn Daniel  Dr. Renaye Daniel  Dr. Margaretmary Dys  Dr. Geanie Berlin        Next Appointment:      Next Appointment: 11/17/2023 11:00 AM    Appointment Type: Laboratory Appointment    Location: [facility] [phone]    Provider: Lab LAB    Reason for Visit: 3 mo psa      E & M CODES: We spent 45 minutes dedicated to evaluation and management time, including face to face interaction, discussions on coordination of care, documentation, result review, and discussion with others as applicable.     * Signed by Austin Daniel, M.D. on 09/13/23 at 9:58 AM (EDT)*

## 2023-09-19 NOTE — Progress Notes (Signed)
MRI of prostate is still currently pending authorization.  RN left voicemail with patient to update on status.  Will continue to follow to ensure authorization/scheduling of MRI as part of next steps prior to finalizing treatment decision.

## 2023-09-25 NOTE — Progress Notes (Signed)
RN contacted [facility] to follow up with MRI of Prostate.  [facility] has made several attempts at contacting patient with no success.   RN left voicemail with patient providing my direct number along with scheduling contact number.  Will continue to follow.

## 2023-09-30 NOTE — Progress Notes (Signed)
Patient is scheduled for MRI on 10/24.  RN spoke with patient and confirmed he is aware of appointment.  Will follow up after MRI results are final to review treatment decision.

## 2023-10-13 ENCOUNTER — Encounter: Payer: Self-pay | Admitting: Urology

## 2023-10-16 ENCOUNTER — Ambulatory Visit
Admission: RE | Admit: 2023-10-16 | Discharge: 2023-10-16 | Disposition: A | Discharge status: Home / Self Care | Payer: BC Managed Care – PPO | Source: Ambulatory Visit | Attending: Urology | Admitting: Urology

## 2023-10-16 DIAGNOSIS — C61 Malignant neoplasm of prostate: Secondary | ICD-10-CM

## 2023-10-16 MED ORDER — GADOPICLENOL 0.5 MMOL/ML IV SOLN
10.0000 mL | Freq: Once | INTRAVENOUS | Status: AC | PRN
  Administered 2023-10-16: 10 mL via INTRAVENOUS

## 2023-11-04 NOTE — Progress Notes (Signed)
Patient saw Dr. Laverle Patter today to review recent MRI results.    Plan will be to proceed with additional check of PSA and schedule for biopsy in the next few months.    Will continue to follow.

## 2023-11-07 ENCOUNTER — Other Ambulatory Visit: Payer: BC Managed Care – PPO

## 2023-11-14 ENCOUNTER — Other Ambulatory Visit (HOSPITAL_COMMUNITY): Payer: Self-pay | Admitting: Urology

## 2023-11-14 ENCOUNTER — Other Ambulatory Visit: Payer: Self-pay | Admitting: Urology

## 2023-11-14 DIAGNOSIS — C61 Malignant neoplasm of prostate: Secondary | ICD-10-CM

## 2023-11-14 NOTE — Progress Notes (Signed)
Patient will proceed with biopsy in OR on 12/23.

## 2023-11-14 NOTE — Progress Notes (Signed)
Patient is scheduled for prostate biopsy at [facility] on 11/26.    RN left voicemail for patient.  Will follow up after biopsy/pathology is final.

## 2023-12-01 ENCOUNTER — Other Ambulatory Visit: Payer: Self-pay | Admitting: Urology

## 2023-12-01 MED ORDER — FLEET ENEMA RE ENEM: 1.0000 | ENEMA | Freq: Once | RECTAL | Status: AC

## 2023-12-01 NOTE — Patient Instructions (Signed)
SURGICAL WAITING ROOM VISITATION Patients having surgery or a procedure may have no more than 2 support people in the waiting area - these visitors may rotate in the visitor waiting room.   Due to an increase in RSV and influenza rates and associated hospitalizations, children ages 35 and under may not visit patients in [facility] hospitals. If the patient needs to stay at the hospital during part of their recovery, the visitor guidelines for inpatient rooms apply.  PRE-OP VISITATION  Pre-op nurse will coordinate an appropriate time for 1 support person to accompany the patient in pre-op.  This support person may not rotate.  This visitor will be contacted when the time is appropriate for the visitor to come back in the pre-op area.  Please refer to the [facility] website for the visitor guidelines for Inpatients (after your surgery is over and you are in a regular room).  You are not required to quarantine at this time prior to your surgery. However, you must do this: Hand Hygiene often Do NOT share personal items Notify your provider if you are in close contact with someone who has COVID or you develop fever 100.4 or greater, new onset of sneezing, cough, sore throat, shortness of breath or body aches.  If you test positive for Covid or have been in contact with anyone that has tested positive in the last 10 days please notify you surgeon.    Your procedure is scheduled on:  Monday  December 15, 2023  Report to [facility] Main Entrance: [place] entrance where the [employer] is available.   Report to admitting at: 05:15    AM  Call this number if you have any questions or problems the morning of surgery [phone]  DO NOT EAT OR DRINK ANYTHING AFTER MIDNIGHT THE NIGHT PRIOR TO YOUR SURGERY / PROCEDURE.   FOLLOW  ANY ADDITIONAL PRE OP INSTRUCTIONS YOU RECEIVED FROM YOUR SURGEON'S OFFICE!!!  FLEET ENEMA: Obtain one(1) Fleet Enema (sodium phosphate 7-19 gm / 118 ml enema)  and use (according to the directions on the box) the morning of your surgery. If you have any questions, please contact your Surgeon's office for additional information.    Oral Hygiene is also important to reduce your risk of infection.        Remember - BRUSH YOUR TEETH THE MORNING OF SURGERY WITH YOUR REGULAR TOOTHPASTE  Do NOT smoke after Midnight the night before surgery.  STOP TAKING all Vitamins, Herbs and supplements 1 week before your surgery.   Take ONLY these medicines the morning of surgery with A SIP OF WATER: Levofloxacin (Levaquin) ???                    You may not have any metal on your body including jewelry, and body piercing  Do not wear  lotions, powders, cologne, or deodorant   Men may shave face and neck.  Contacts, Hearing Aids, dentures or bridgework may not be worn into surgery. DENTURES WILL BE REMOVED PRIOR TO SURGERY PLEASE DO NOT APPLY "Poly grip" OR ADHESIVES!!!  Patients discharged on the day of surgery will not be allowed to drive home.  Someone NEEDS to stay with you for the first 24 hours after anesthesia.  Do not bring your home medications to the hospital. The Pharmacy will dispense medications listed on your medication list to you during your admission in the Hospital.  Please read over the following fact sheets you were given: IF YOU HAVE  QUESTIONS ABOUT YOUR PRE-OP INSTRUCTIONS, PLEASE CALL [phone]  Joyce Gross, RN  [facility] - Preparing for Surgery Before surgery, you can play an important role.  Because skin is not sterile, your skin needs to be as free of germs as possible.  You can reduce the number of germs on your skin by washing with CHG (chlorahexidine gluconate) soap before surgery.  CHG is an antiseptic cleaner which kills germs and bonds with the skin to continue killing germs even after washing. Please DO NOT use if you have an allergy to CHG or antibacterial soaps.  If your skin becomes reddened/irritated stop using the CHG and inform  your nurse when you arrive at Short Stay. Do not shave (including legs and underarms) for at least 48 hours prior to the first CHG shower.  You may shave your face/neck.  Please follow these instructions carefully:  1.  Shower with CHG Soap the night before surgery and the  morning of surgery.  2.  If you choose to wash your hair, wash your hair first as usual with your normal  shampoo.  3.  After you shampoo, rinse your hair and body thoroughly to remove the shampoo.                             4.  Use CHG as you would any other liquid soap.  You can apply chg directly to the skin and wash.  Gently with a scrungie or clean washcloth.  5.  Apply the CHG Soap to your body ONLY FROM THE NECK DOWN.   Do not use on face/ open                           Wound or open sores. Avoid contact with eyes, ears mouth and genitals (private parts).                       Wash face,  Genitals (private parts) with your normal soap.             6.  Wash thoroughly, paying special attention to the area where your  surgery  will be performed.  7.  Thoroughly rinse your body with warm water from the neck down.  8.  DO NOT shower/wash with your normal soap after using and rinsing off the CHG Soap.            9.  Pat yourself dry with a clean towel.            10.  Wear clean pajamas.            11.  Place clean sheets on your bed the night of your first shower and do not  sleep with pets.  ON THE DAY OF SURGERY : Do not apply any lotions/deodorants the morning of surgery.  Please wear clean clothes to the hospital/surgery center.    FAILURE TO FOLLOW THESE INSTRUCTIONS MAY RESULT IN THE CANCELLATION OF YOUR SURGERY  PATIENT SIGNATURE_________________________________  NURSE SIGNATURE__________________________________  ________________________________________________________________________

## 2023-12-01 NOTE — Progress Notes (Signed)
COVID Vaccine received:  []  No [x]  Yes Date of any COVID positive Test in last 90 days:  None   PCP -   Renaye Rakers, MD   Lucile Crater, NP-BC at [facility] Cardiologist - None Oncology- Margaretmary Dys,  MD    Chest x-ray -  EKG - 08-04-2023  Epic Stress Test -  ECHO -  Cardiac Cath -    PCR screen: []  Ordered & Completed           []   No Order but Needs PROFEND           [x]   N/A for this surgery   Surgery Plan:  [x]  Ambulatory                            []  Outpatient in bed                            []  Admit   Anesthesia:    []  General  []  Spinal                           []   Choice [x]   MAC   Bowel Prep - []  No  [x]   Yes 1 Fleet Enema _the morning of surgery   Pacemaker / ICD device [x]  No []  Yes   Spinal Cord Stimulator:[x]  No []  Yes       History of Sleep Apnea? [x]  No []  Yes   CPAP used?- [x]  No []  Yes     Does the patient monitor blood sugar?          []  No [x]  Yes  []  N/A   Patient has: []  NO Hx DM   [x]  Pre-DM                 []  DM1  []   DM2 Last A1c was 5.8 on 04-18-23   Diabetic medications/ instructions: Janumet (sitagliptin/ metformin) 50-1000mg  ;    Hold DOS   Blood Thinner / Instructions:  none Aspirin Instructions:  none   ERAS Protocol Ordered: [x]  No  []  Yes Patient is to be NPO after: Midnight prior   Activity level: Patient is able to climb a flight of stairs without difficulty; [x]  No CP  [x]  No SOB.  Patient can perform ADLs without assistance.    Anesthesia review:  Pre-DM, HTN,    Patient denies shortness of breath, fever, cough and chest pain at PAT appointment.   Patient verbalized understanding and agreement to the Pre-Surgical Instructions that were given to them at this PAT appointment. Patient was also educated of the need to review these PAT instructions again prior to hisr surgery.I reviewed the appropriate phone numbers to call if they have any and questions or concerns.

## 2023-12-02 ENCOUNTER — Encounter (HOSPITAL_COMMUNITY): Payer: Self-pay

## 2023-12-02 ENCOUNTER — Other Ambulatory Visit: Payer: Self-pay

## 2023-12-02 ENCOUNTER — Encounter (HOSPITAL_COMMUNITY)
Admission: RE | Admit: 2023-12-02 | Discharge: 2023-12-02 | Disposition: A | Discharge status: Home / Self Care | Payer: BC Managed Care – PPO | Source: Ambulatory Visit | Attending: Urology | Admitting: Urology

## 2023-12-02 VITALS — BP 145/94 | HR 64 | Temp 98.0°F | Resp 14 | Ht 73.0 in | Wt 217.0 lb

## 2023-12-02 DIAGNOSIS — Z01812 Encounter for preprocedural laboratory examination: Secondary | ICD-10-CM | POA: Insufficient documentation

## 2023-12-02 DIAGNOSIS — C61 Malignant neoplasm of prostate: Secondary | ICD-10-CM | POA: Insufficient documentation

## 2023-12-02 DIAGNOSIS — Z01818 Encounter for other preprocedural examination: Secondary | ICD-10-CM

## 2023-12-02 HISTORY — DX: Essential (primary) hypertension: I10

## 2023-12-02 HISTORY — DX: Malignant (primary) neoplasm, unspecified: C80.1

## 2023-12-02 LAB — CBC
HCT: 43.6 % (ref 39.0–52.0)
Hemoglobin: 15.1 g/dL (ref 13.0–17.0)
MCH: 30.7 pg (ref 26.0–34.0)
MCHC: 34.6 g/dL (ref 30.0–36.0)
MCV: 88.6 fL (ref 80.0–100.0)
Platelets: 235 10*3/uL (ref 150–400)
RBC: 4.92 MIL/uL (ref 4.22–5.81)
RDW: 13.8 % (ref 11.5–15.5)
WBC: 4.8 10*3/uL (ref 4.0–10.5)
nRBC: 0 % (ref 0.0–0.2)

## 2023-12-02 LAB — BASIC METABOLIC PANEL
Anion gap: 8 (ref 5–15)
BUN: 16 mg/dL (ref 6–20)
CO2: 25 mmol/L (ref 22–32)
Calcium: 9.3 mg/dL (ref 8.9–10.3)
Chloride: 106 mmol/L (ref 98–111)
Creatinine, Ser: 1.15 mg/dL (ref 0.61–1.24)
GFR, Estimated: >60 mL/min (ref >60)
Glucose, Bld: 96 mg/dL (ref 70–99)
Potassium: 4.6 mmol/L (ref 3.5–5.1)
Sodium: 139 mmol/L (ref 135–145)

## 2023-12-12 NOTE — H&P (Signed)
CC: Prostate Cancer   Austin Daniel is a 57 year old gentleman who was noted to have an elevated PSA of 8.96 in December 2023 that was up from a baseline level of 2.4 in 2013. He elected to monitor and recheck this and it further increased to 15.1 in July 2024 prompting a TRUS biopsy of the prostate on 08/12/23 which demonstrated 1 out of 13 biopsies positive for Gleason 3+3=6 adenocarcinoma. He was seen in the prostate multidisciplinary clinic in September and due to the discordance of his clinical information and PSMA PET imaging, we agreed to have him follow up with an MRI of the prostate for further evaluation and discussion.   Family history: None.   Imaging studies: He did undergo a PSMA PET scan on 09/10/2023. This demonstrated no evidence of metastatic disease but a very large area of avidity encompassing the majority of the left side of the prostate.   PMH: He has a history of diabetes and hyperlipidemia.  PSH: No abdominal surgeries.   TNM stage: cT1c Nx Mx  PSA: 15.1  Gleason score: 3+3=6 (GG 1)  Biopsy (08/12/23): 1/13 cores positive  Left: L lateral mid (10%, 3+3=6)  Right: Benign  TZ: Benign  Prostate volume: 73.7 cc  PSAD: 0.20   Nomogram  OC disease: 74%  EPE: 24%  SVI: 1%  LNI: 2%  PFS (5 year, 10 year): 90%, 83%   Urinary function: IPSS is 10.  Erectile function: SHIM score is 24.     ALLERGIES: No Allergies    MEDICATIONS: Atorvastatin Calcium 20 mg tablet 1 tablet PO Daily  Celecoxib 200 mg capsule 1 capsule PO Daily  Clindamycin Phosphate 1 % solution, non-oral 1 ml PO Daily  Janumet Xr 50 mg-1,000 mg tablet,extended release multiphase 24 hr 1 tablet PO Daily  Sildenafil Citrate 100 mg tablet 1 tablet PO Daily     GU PSH: Vasectomy - 2013     NON-GU PSH: Back Surgery (Unspecified) Elbow Arthroscopy, Right - 04/25/2023 Shoulder Arthroscopy/surgery, Bilateral Visit Complexity (formerly GPC1X) - 07/21/2023     GU PMH: Prostate Cancer - 09/12/2023, He has  T1c Nx Mx GG1 small volume prostate cancer but his PSA is rising rapidly and at 13 he has intermediate risk disease. Because of the level and rapid rise of the PSA, I have ordered a PMSA PET scan. If that is negative he could be a candidate for surveillance but at 25 I would be concerned about that option. His prostate is too big for brachytherapy. I discussed RALP and XRT with [facility] and fiducials in some detail and gave him handouts. I have ordered a PSMA PET to make sure he doesn't have uptake in the prostate that would suggest a focus of missed higher grade disease or mets. I will get him seen in the [facility] to discuss options further. , - 08/20/2023 BPH w/LUTS, He has mild LUTS with a 73ml prostate. - 08/20/2023, He has a large benign prostate on exam with mild LUTS., - 07/21/2023, - 12/27/2022 Elevated PSA - 08/20/2023, His PSA is up markedly to 15.1. I will get him set up for a prostate [place] and biopsy and reviewed the risks of bleeding, infection and voiding difficulty. He would like this done in the OR. , - 07/21/2023, - 12/27/2022 Nocturia - 08/20/2023, - 07/21/2023, - 12/27/2022 Primary hypogonadism, Hypogonadism, testicular - 2014      PMH Notes:  1898-12-23 00:00:00 - Note: Normal Routine History And Physical Adult   NON-GU PMH: Personal history  of other endocrine, nutritional and metabolic disease, History of diabetes mellitus - 2014 Diabetes Type 2 Hypercholesterolemia    FAMILY HISTORY: Diabetes - Runs In Family Family Health Status - Father alive at age 7 - Runs In Family Family Health Status - Mother's Age - Runs In Family   SOCIAL HISTORY: Marital Status: Married Ethnicity: Not Hispanic Or Latino; Race: Black or African American Current Smoking Status: Patient has never smoked.   Tobacco Use Assessment Completed: Used Tobacco in last 30 days? Does not use smokeless tobacco. Does drink.  Does not use drugs. Drinks 3 caffeinated drinks per day.     Notes: Never A Smoker, Marital History  - Currently Married, Caffeine Use, Alcohol Use, Occupation:   REVIEW OF SYSTEMS:    GU Review Male:   Patient denies frequent urination, hard to postpone urination, burning/ pain with urination, get up at night to urinate, leakage of urine, stream starts and stops, trouble starting your streams, and have to strain to urinate .  Gastrointestinal (Lower):   Patient denies diarrhea and constipation.  Gastrointestinal (Upper):   Patient denies vomiting and nausea.  Constitutional:   Patient denies fever, night sweats, weight loss, and fatigue.  Skin:   Patient denies skin rash/ lesion and itching.  Eyes:   Patient denies blurred vision and double vision.  Ears/ Nose/ Throat:   Patient denies sore throat and sinus problems.  Hematologic/Lymphatic:   Patient denies swollen glands and easy bruising.  Cardiovascular:   Patient denies leg swelling and chest pains.  Respiratory:   Patient denies cough and shortness of breath.  Endocrine:   Patient denies excessive thirst.  Musculoskeletal:   Patient denies back pain and joint pain.  Neurological:   Patient denies headaches and dizziness.  Psychologic:   Patient denies depression and anxiety.   VITAL SIGNS:     Weight 218 lb / 98.88 kg  Height 73 in / 185.42 cm  Temperature 97.3 F / 36.2 C  BMI 28.8 kg/m   MULTI-SYSTEM PHYSICAL EXAMINATION:    Constitutional: Well-nourished. No physical deformities. Normally developed. Good grooming.     Complexity of Data:  Lab Test Review:   PSA  Records Review:   Pathology Reports, Previous Patient Records  X-Ray Review: MRI Prostate GSORAD: Reviewed Films.     07/14/23 12/19/22 07/23/12 04/29/12  PSA  Total PSA 15.10 ng/mL 8.96 ng/ml 2.24  2.46     10/19/12 07/23/12 04/29/12  Hormones  Testosterone, Total 342.56  599.01  293.77    Notes:                     CLINICAL DATA: Elevated PSA level of 15.1. R97.20. Biopsy dated  06/12/2023 revealed Gleason 3+3=6 prostate adenocarcinoma in the  left  lateral mid gland. C61.   EXAM:  MR PROSTATE WITHOUT AND WITH CONTRAST   TECHNIQUE:  Multiplanar multisequence MRI images were obtained of the pelvis  centered about the prostate. Pre and post contrast images were  obtained.   CONTRAST: 10 cc Vueway   COMPARISON: [facility] PET-CT dated 09/08/2023 showing substantial  accentuated activity in the left prostate base and extending into  the mid gland.   FINDINGS:  Prostate: Encapsulated nodularity in the transition zone compatible  with benign prostatic hypertrophy. Hazy and linear low T2 signal in  the peripheral zone is nonfocal, likely postinflammatory, and is  considered PI-RADS category 2.   In the region of concern based on the recent PET-CT, in the left  base  and mid gland transition zone, we demonstrate only a large  heterogeneous typical BPH nodule as shown on image 10 of series 5,  without restriction of diffusion or suspicious MRI imaging  characteristics. This is considered a discordant result from the  PET-CT as the MRI appearance would be a post PI-RADS category 2.   Note is made of accentuated T1 signal in the peripheral zone  bilaterally, compatible with blood products from prior biopsy.   Overall, no focal lesion of intermediate or higher suspicion for  prostate cancer is identified.   Volume: 3D volumetric assessment: Prostate volume 90.2 cc (5.9 by  5.1 by 5.6 cm).   Transcapsular spread: Absent   Seminal vesicle involvement: Absent   Neurovascular bundle involvement: Absent   Pelvic adenopathy: Absent   Bone metastasis: Absent   Other findings: No other significant findings.   IMPRESSION:  1. Discordant result compared to prior [facility] PET-CT, with the  lesion of concern on prior PET-CT appearing to represent a typical  BPH nodule on MRI, and without accentuated restriction of diffusion.  No focal lesion of intermediate or higher suspicion for prostate  cancer is identified.  2. Benign prostatic  hypertrophy and prostatomegaly.    Electronically Signed  By: Gaylyn Rong M.D.  On: 10/17/2023 14:10       ASSESSMENT:      ICD-10 Details  1 GU:   Prostate Cancer - C61   2   BPH w/LUTS - N40.1   3   Nocturia - R35.1    PLAN:              Notes:   1. Prostate cancer: We reviewed his MRI result which does suggest that his PSMA PET scan results may represent a false positive reading. He also informing today that he was taking an over-the-counter supplement that likely had testosterone in it when his PSA had increased to 15. He has now been off that medication for a few months. His PSA will be repeated today. Assuming that his PSA is not further increasing, I have recommended that we proceed with a confirmatory biopsy sometime later this winter for further evaluation. If he is confirmed to indeed have lower risk disease and his PSMA PET scan is felt to represent a false positive finding, we will then discuss his options for management again including possibly active surveillance.      I reviewed his PSA with him which has decreased to 7.04 confirming that he does appear to have very low risk prostate cancer with a probable false positive finding on his PSMA PET scan earlier this year. He will proceed with a confirmatory biopsy over the next couple of months.

## 2023-12-15 ENCOUNTER — Encounter (HOSPITAL_COMMUNITY): Payer: Self-pay | Admitting: Urology

## 2023-12-15 ENCOUNTER — Ambulatory Visit (HOSPITAL_COMMUNITY)
Admission: RE | Admit: 2023-12-15 | Discharge: 2023-12-15 | Disposition: A | Discharge status: Home / Self Care | Payer: BC Managed Care – PPO | Attending: Urology | Admitting: Urology

## 2023-12-15 ENCOUNTER — Ambulatory Visit (HOSPITAL_COMMUNITY)
Admission: RE | Admit: 2023-12-15 | Discharge: 2023-12-15 | Disposition: A | Discharge status: Home / Self Care | Payer: BC Managed Care – PPO | Source: Ambulatory Visit | Attending: Family Medicine | Admitting: Family Medicine

## 2023-12-15 ENCOUNTER — Ambulatory Visit (HOSPITAL_COMMUNITY): Payer: BC Managed Care – PPO | Admitting: Anesthesiology

## 2023-12-15 ENCOUNTER — Encounter (HOSPITAL_COMMUNITY)
Admission: RE | Disposition: A | Discharge status: Home / Self Care | Payer: Self-pay | Source: Home / Self Care | Attending: Urology

## 2023-12-15 ENCOUNTER — Other Ambulatory Visit: Payer: Self-pay

## 2023-12-15 DIAGNOSIS — N401 Enlarged prostate with lower urinary tract symptoms: Secondary | ICD-10-CM | POA: Insufficient documentation

## 2023-12-15 DIAGNOSIS — C61 Malignant neoplasm of prostate: Secondary | ICD-10-CM | POA: Insufficient documentation

## 2023-12-15 DIAGNOSIS — R351 Nocturia: Secondary | ICD-10-CM | POA: Insufficient documentation

## 2023-12-15 DIAGNOSIS — N411 Chronic prostatitis: Secondary | ICD-10-CM | POA: Diagnosis not present

## 2023-12-15 HISTORY — PX: PROSTATE BIOPSY: SHX241

## 2023-12-15 LAB — GLUCOSE, CAPILLARY
Glucose-Capillary: 104 mg/dL — ABNORMAL HIGH (ref 70–99)
Glucose-Capillary: 106 mg/dL — ABNORMAL HIGH (ref 70–99)

## 2023-12-15 SURGERY — BIOPSY, PROSTATE, RECTAL APPROACH, WITH US GUIDANCE
Anesthesia: Monitor Anesthesia Care | Site: Prostate

## 2023-12-15 MED ORDER — FENTANYL CITRATE (PF) 100 MCG/2ML IJ SOLN
INTRAMUSCULAR | Status: DC | PRN
  Administered 2023-12-15: 100 ug via INTRAVENOUS

## 2023-12-15 MED ORDER — LIDOCAINE HCL (PF) 2 % IJ SOLN
INTRAMUSCULAR | Status: AC
  Filled 2023-12-15: qty 5

## 2023-12-15 MED ORDER — MIDAZOLAM HCL 2 MG/2ML IJ SOLN
INTRAMUSCULAR | Status: AC
  Filled 2023-12-15: qty 2

## 2023-12-15 MED ORDER — CHLORHEXIDINE GLUCONATE 0.12 % MT SOLN
15.0000 mL | Freq: Once | OROMUCOSAL | Status: AC
  Administered 2023-12-15: 15 mL via OROMUCOSAL

## 2023-12-15 MED ORDER — MIDAZOLAM HCL 5 MG/5ML IJ SOLN
INTRAMUSCULAR | Status: DC | PRN
  Administered 2023-12-15: 2 mg via INTRAVENOUS

## 2023-12-15 MED ORDER — SODIUM CHLORIDE 0.9 % IV SOLN
2.0000 g | INTRAVENOUS | Status: AC
  Administered 2023-12-15: 2 g via INTRAVENOUS
  Filled 2023-12-15: qty 20

## 2023-12-15 MED ORDER — LIDOCAINE HCL 2 % IJ SOLN
INTRAMUSCULAR | Status: DC | PRN
  Administered 2023-12-15: 10 mL

## 2023-12-15 MED ORDER — PROPOFOL 500 MG/50ML IV EMUL
INTRAVENOUS | Status: DC | PRN
  Administered 2023-12-15: 130 ug/kg/min via INTRAVENOUS

## 2023-12-15 MED ORDER — ORAL CARE MOUTH RINSE: 15.0000 mL | Freq: Once | OROMUCOSAL | Status: AC

## 2023-12-15 MED ORDER — LIDOCAINE HCL 2 % IJ SOLN
INTRAMUSCULAR | Status: AC
  Filled 2023-12-15: qty 20

## 2023-12-15 MED ORDER — FENTANYL CITRATE PF 50 MCG/ML IJ SOSY: 25.0000 ug | PREFILLED_SYRINGE | INTRAMUSCULAR | Status: DC | PRN

## 2023-12-15 MED ORDER — PROPOFOL 1000 MG/100ML IV EMUL
INTRAVENOUS | Status: AC
  Filled 2023-12-15: qty 100

## 2023-12-15 MED ORDER — PROPOFOL 10 MG/ML IV BOLUS
INTRAVENOUS | Status: AC
  Filled 2023-12-15: qty 20

## 2023-12-15 MED ORDER — ONDANSETRON HCL 4 MG/2ML IJ SOLN: 4.0000 mg | Freq: Once | INTRAMUSCULAR | Status: DC | PRN

## 2023-12-15 MED ORDER — FENTANYL CITRATE (PF) 100 MCG/2ML IJ SOLN
INTRAMUSCULAR | Status: AC
  Filled 2023-12-15: qty 2

## 2023-12-15 MED ORDER — LACTATED RINGERS IV SOLN: INTRAVENOUS | Status: DC

## 2023-12-15 MED ORDER — PROPOFOL 10 MG/ML IV BOLUS
INTRAVENOUS | Status: DC | PRN
  Administered 2023-12-15: 10 mg via INTRAVENOUS
  Administered 2023-12-15 (×2): 20 mg via INTRAVENOUS
  Administered 2023-12-15: 10 mg via INTRAVENOUS

## 2023-12-15 MED ORDER — INSULIN ASPART 100 UNIT/ML IJ SOLN
0.0000 [IU] | INTRAMUSCULAR | Status: DC | PRN
Start: 2023-12-15 — End: 2023-12-15

## 2023-12-15 SURGICAL SUPPLY — 7 items
GOWN STRL REUS W/ TWL XL LVL3 (GOWN DISPOSABLE) ×1 IMPLANT
INST BIOPSY MAXCORE 18GX25 (NEEDLE) IMPLANT
INSTR BIOPSY MAXCORE 18GX20 (NEEDLE) IMPLANT
NDL SPNL 22GX7 QUINCKE BK (NEEDLE) ×1 IMPLANT
NEEDLE SPNL 22GX7 QUINCKE BK (NEEDLE) ×1
SYR CONTROL 10ML LL (SYRINGE) IMPLANT
UNDERPAD 30X36 HEAVY ABSORB (UNDERPADS AND DIAPERS) ×1 IMPLANT

## 2023-12-15 NOTE — Op Note (Signed)
Preoperative diagnosis: Prostate cancer  Postoperative diagnosis: Prostate cancer  Procedure: 1. Transrectal ultrasound of the prostate 2. Prostate needle biopsy  Surgeon: Dr. Rolly Salter, Montez Hageman.  Anesthesia: IV sedation  Complications: None  EBL: Minimal  Specimens: Specimens were obtained from the lateral and parasagittal regions of the base, mid, and apex of the prostate bilaterally. A total of 12 biopsy cores were obtained.  Disposition specimens: Pathology lab.  Indication: Austin Daniel is a 57 y.o. with prostate cancer on active surveillance.  He presents today for a surveillance protocol biopsy. I discussed the potential benefits and risks of the procedure, side effects of the proposed treatment, the likelihood of the patient achieving the goals of the procedure, and any potential problems that might occur during the procedure or recuperation.  Informed consent was obtained.   Description of procedure:  The patient was taken to the operating room and conscious sedation was administered. He had been provided preoperative antibiotics and did prepare himself with an enema prior to his procedure. He was laid in the left lateral decubitus position. The transrectal ultrasound probe was then placed into the rectum and used to visualize the prostate. 10 cc of 1% lidocaine was then utilized to provide local anesthesia with a periprostatic nerve block. Images of the prostate were then obtained systematically. The prostate was homogenous without suspicious lesions.  No median lobe. The prostate volume was measured at 72.1 cc. Systematic biopsies of the prostate were then performed under ultrasound guidance. A total of 12 cores were obtained. Biopsies were taken from the lateral and parasagittal regions of the base, mid, and apex of the prostate bilaterally.  Following removal of the transrectal ultrasound, a digital rectal exam was performed and it was confirmed that no significant bleeding  was present. The patient tolerated the procedure well and without complications.

## 2023-12-15 NOTE — Anesthesia Preprocedure Evaluation (Signed)
Anesthesia Evaluation  Patient identified by MRN, date of birth, ID band Patient awake    Reviewed: Allergy & Precautions, H&P , NPO status , Patient's Chart, lab work & pertinent test results  Airway Mallampati: II  TM Distance: >3 FB Neck ROM: Full    Dental no notable dental hx.    Pulmonary neg pulmonary ROS   Pulmonary exam normal breath sounds clear to auscultation       Cardiovascular hypertension, Normal cardiovascular exam Rhythm:Regular Rate:Normal     Neuro/Psych negative neurological ROS  negative psych ROS   GI/Hepatic negative GI ROS, Neg liver ROS,,,  Endo/Other  negative endocrine ROS    Renal/GU negative Renal ROS  negative genitourinary   Musculoskeletal negative musculoskeletal ROS (+)    Abdominal   Peds negative pediatric ROS (+)  Hematology negative hematology ROS (+)   Anesthesia Other Findings   Reproductive/Obstetrics negative OB ROS                             Anesthesia Physical Anesthesia Plan  ASA: 2  Anesthesia Plan: MAC   Post-op Pain Management: Minimal or no pain anticipated   Induction: Intravenous  PONV Risk Score and Plan: 1 and Propofol infusion and Treatment may vary due to age or medical condition  Airway Management Planned: Simple Face Mask  Additional Equipment:   Intra-op Plan:   Post-operative Plan:   Informed Consent: I have reviewed the patients History and Physical, chart, labs and discussed the procedure including the risks, benefits and alternatives for the proposed anesthesia with the patient or authorized representative who has indicated his/her understanding and acceptance.     Dental advisory given  Plan Discussed with: CRNA and Surgeon  Anesthesia Plan Comments:        Anesthesia Quick Evaluation

## 2023-12-15 NOTE — Anesthesia Postprocedure Evaluation (Signed)
Anesthesia Post Note  Patient: Austin Daniel  Procedure(s) Performed: TRANSRECTAL ULTRASOUND GUIDED PROSTATE BIOSPY (Prostate)     Patient location during evaluation: PACU Anesthesia Type: MAC Level of consciousness: awake and alert Pain management: pain level controlled Vital Signs Assessment: post-procedure vital signs reviewed and stable Respiratory status: spontaneous breathing, nonlabored ventilation, respiratory function stable and patient connected to nasal cannula oxygen Cardiovascular status: stable and blood pressure returned to baseline Postop Assessment: no apparent nausea or vomiting Anesthetic complications: no  No notable events documented.  Last Vitals:  Vitals:   12/15/23 0754 12/15/23 0800  BP: 101/69 116/79  Pulse: 69 69  Resp: 16 11  Temp: (!) 36.2 C   SpO2: 100% 100%    Last Pain:  Vitals:   12/15/23 0800  TempSrc:   PainSc: Asleep                 Gilberto Stanforth S

## 2023-12-15 NOTE — Transfer of Care (Signed)
Immediate Anesthesia Transfer of Care Note  Patient: Austin Daniel  Procedure(s) Performed: TRANSRECTAL ULTRASOUND GUIDED PROSTATE BIOSPY (Prostate)  Patient Location: PACU  Anesthesia Type:MAC  Level of Consciousness: sedated  Airway & Oxygen Therapy: Patient Spontanous Breathing and Patient connected to face mask oxygen  Post-op Assessment: Post -op Vital signs reviewed and stable  Post vital signs: Reviewed and stable  Last Vitals:  Vitals Value Taken Time  BP 101/69 12/15/23 0754  Temp 36.2 C 12/15/23 0754  Pulse 69 12/15/23 0755  Resp 16 12/15/23 0755  SpO2 100 % 12/15/23 0755    Last Pain:  Vitals:   12/15/23 0551  TempSrc:   PainSc: 0-No pain         Complications: No notable events documented.

## 2023-12-16 ENCOUNTER — Encounter (HOSPITAL_COMMUNITY): Payer: Self-pay | Admitting: Urology

## 2023-12-16 LAB — SURGICAL PATHOLOGY

## 2023-12-19 NOTE — Progress Notes (Signed)
Patient proceed with prostate biopsy and results were provided to patient from Dr. Laverle Patter.  Patient will remain on active surveillance at this time since pathology confirmed very low risk prostate cancer.  Patient will be set up for 6 month PSA.
